# Patient Record
Sex: Female | Born: 1960 | Race: Black or African American | Hispanic: No | Smoking: Current every day smoker
Health system: Southern US, Community
[De-identification: ages and names within clinical notes are randomized; demographics above are authoritative.]

## PROBLEM LIST (undated history)

## (undated) DIAGNOSIS — F410 Panic disorder [episodic paroxysmal anxiety] without agoraphobia: Secondary | ICD-10-CM

## (undated) HISTORY — PX: ABDOMINAL HYSTERECTOMY: SHX81

## (undated) HISTORY — PX: CHOLECYSTECTOMY: SHX55

---

## 2004-02-06 ENCOUNTER — Emergency Department: Payer: Self-pay | Admitting: General Practice

## 2004-11-22 ENCOUNTER — Emergency Department: Payer: Self-pay | Admitting: Emergency Medicine

## 2004-12-08 ENCOUNTER — Ambulatory Visit: Payer: Self-pay | Admitting: Family Medicine

## 2006-09-26 ENCOUNTER — Emergency Department: Payer: Self-pay | Admitting: Emergency Medicine

## 2007-05-06 ENCOUNTER — Emergency Department: Payer: Self-pay | Admitting: Emergency Medicine

## 2009-01-31 ENCOUNTER — Emergency Department: Payer: Self-pay | Admitting: Emergency Medicine

## 2009-03-11 ENCOUNTER — Emergency Department: Payer: Self-pay | Admitting: Emergency Medicine

## 2009-09-20 ENCOUNTER — Emergency Department: Payer: Self-pay | Admitting: Internal Medicine

## 2009-12-16 ENCOUNTER — Inpatient Hospital Stay: Payer: Self-pay | Admitting: Surgery

## 2009-12-20 LAB — PATHOLOGY REPORT

## 2010-03-03 ENCOUNTER — Emergency Department: Payer: Self-pay | Admitting: Emergency Medicine

## 2011-07-14 ENCOUNTER — Emergency Department: Payer: Self-pay | Admitting: *Deleted

## 2013-11-30 ENCOUNTER — Emergency Department: Payer: Self-pay

## 2013-11-30 LAB — CBC
HCT: 46.9 % (ref 35.0–47.0)
HGB: 15.3 g/dL (ref 12.0–16.0)
MCH: 29.4 pg (ref 26.0–34.0)
MCHC: 32.7 g/dL (ref 32.0–36.0)
MCV: 90 fL (ref 80–100)
Platelet: 213 10*3/uL (ref 150–440)
RBC: 5.23 10*6/uL — ABNORMAL HIGH (ref 3.80–5.20)
RDW: 14.5 % (ref 11.5–14.5)
WBC: 14 10*3/uL — ABNORMAL HIGH (ref 3.6–11.0)

## 2013-11-30 LAB — BASIC METABOLIC PANEL
Anion Gap: 9 (ref 7–16)
BUN: 8 mg/dL (ref 7–18)
CALCIUM: 9.1 mg/dL (ref 8.5–10.1)
CREATININE: 0.6 mg/dL (ref 0.60–1.30)
Chloride: 104 mmol/L (ref 98–107)
Co2: 24 mmol/L (ref 21–32)
EGFR (African American): >60
GLUCOSE: 99 mg/dL (ref 65–99)
Osmolality: 272 (ref 275–301)
POTASSIUM: 3.8 mmol/L (ref 3.5–5.1)
SODIUM: 137 mmol/L (ref 136–145)

## 2013-11-30 LAB — URINALYSIS, COMPLETE
BACTERIA: NONE SEEN
BLOOD: NEGATIVE
Bilirubin,UR: NEGATIVE
GLUCOSE, UR: NEGATIVE mg/dL (ref 0–75)
Ketone: NEGATIVE
Nitrite: NEGATIVE
PH: 5 (ref 4.5–8.0)
Protein: NEGATIVE
RBC,UR: NONE SEEN /HPF (ref 0–5)
Specific Gravity: 1.025 (ref 1.003–1.030)
Squamous Epithelial: 8

## 2013-11-30 LAB — TROPONIN I: Troponin-I: <0.02 ng/mL

## 2014-11-30 ENCOUNTER — Other Ambulatory Visit: Payer: Self-pay | Admitting: Internal Medicine

## 2014-11-30 DIAGNOSIS — Z1231 Encounter for screening mammogram for malignant neoplasm of breast: Secondary | ICD-10-CM

## 2014-12-05 ENCOUNTER — Ambulatory Visit: Payer: Self-pay

## 2014-12-07 ENCOUNTER — Ambulatory Visit: Payer: Self-pay | Attending: Internal Medicine

## 2014-12-23 ENCOUNTER — Emergency Department: Payer: 59

## 2014-12-23 ENCOUNTER — Emergency Department
Admission: EM | Admit: 2014-12-23 | Discharge: 2014-12-23 | Disposition: A | Discharge status: Home / Self Care | Payer: 59 | Attending: Emergency Medicine | Admitting: Emergency Medicine

## 2014-12-23 ENCOUNTER — Encounter: Payer: Self-pay | Admitting: Emergency Medicine

## 2014-12-23 DIAGNOSIS — Y9389 Activity, other specified: Secondary | ICD-10-CM | POA: Insufficient documentation

## 2014-12-23 DIAGNOSIS — S79912A Unspecified injury of left hip, initial encounter: Secondary | ICD-10-CM | POA: Insufficient documentation

## 2014-12-23 DIAGNOSIS — Z720 Tobacco use: Secondary | ICD-10-CM | POA: Insufficient documentation

## 2014-12-23 DIAGNOSIS — S060X1A Concussion with loss of consciousness of 30 minutes or less, initial encounter: Secondary | ICD-10-CM | POA: Diagnosis not present

## 2014-12-23 DIAGNOSIS — S199XXA Unspecified injury of neck, initial encounter: Secondary | ICD-10-CM | POA: Diagnosis not present

## 2014-12-23 DIAGNOSIS — Y92410 Unspecified street and highway as the place of occurrence of the external cause: Secondary | ICD-10-CM | POA: Insufficient documentation

## 2014-12-23 DIAGNOSIS — S0990XA Unspecified injury of head, initial encounter: Secondary | ICD-10-CM | POA: Diagnosis present

## 2014-12-23 DIAGNOSIS — S4992XA Unspecified injury of left shoulder and upper arm, initial encounter: Secondary | ICD-10-CM | POA: Insufficient documentation

## 2014-12-23 DIAGNOSIS — S0001XA Abrasion of scalp, initial encounter: Secondary | ICD-10-CM | POA: Diagnosis not present

## 2014-12-23 DIAGNOSIS — Z791 Long term (current) use of non-steroidal anti-inflammatories (NSAID): Secondary | ICD-10-CM | POA: Insufficient documentation

## 2014-12-23 DIAGNOSIS — Y998 Other external cause status: Secondary | ICD-10-CM | POA: Diagnosis not present

## 2014-12-23 DIAGNOSIS — M79605 Pain in left leg: Secondary | ICD-10-CM

## 2014-12-23 MED ORDER — ACETAMINOPHEN 500 MG PO TABS
ORAL_TABLET | ORAL | Status: AC
  Administered 2014-12-23: 1000 mg via ORAL
  Filled 2014-12-23: qty 2

## 2014-12-23 MED ORDER — ACETAMINOPHEN 500 MG PO TABS
1000.0000 mg | ORAL_TABLET | Freq: Once | ORAL | Status: AC
  Administered 2014-12-23: 1000 mg via ORAL

## 2014-12-23 NOTE — ED Notes (Signed)
c-spine precautions removed ATT
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
## 2014-12-23 NOTE — ED Notes (Signed)
Patient transported to CT 

## 2014-12-23 NOTE — Discharge Instructions (Signed)
Abrasion An abrasion is a cut or scrape of the skin. Abrasions do not extend through all layers of the skin and most heal within 10 days. It is important to care for your abrasion properly to prevent infection. CAUSES  Most abrasions are caused by falling on, or gliding across, the ground or other surface. When your skin rubs on something, the outer and inner layer of skin rubs off, causing an abrasion. DIAGNOSIS  Your caregiver will be able to diagnose an abrasion during a physical exam.  TREATMENT  Your treatment depends on how large and deep the abrasion is. Generally, your abrasion will be cleaned with water and a mild soap to remove any dirt or debris. An antibiotic ointment may be put over the abrasion to prevent an infection. A bandage (dressing) may be wrapped around the abrasion to keep it from getting dirty.  You may need a tetanus shot if:  You cannot remember when you had your last tetanus shot.  You have never had a tetanus shot.  The injury broke your skin. If you get a tetanus shot, your arm may swell, get red, and feel warm to the touch. This is common and not a problem. If you need a tetanus shot and you choose not to have one, there is a rare chance of getting tetanus. Sickness from tetanus can be serious.  HOME CARE INSTRUCTIONS   If a dressing was applied, change it at least once a day or as directed by your caregiver. If the bandage sticks, soak it off with warm water.   Wash the area with water and a mild soap to remove all the ointment 2 times a day. Rinse off the soap and pat the area dry with a clean towel.   Reapply any ointment as directed by your caregiver. This will help prevent infection and keep the bandage from sticking. Use gauze over the wound and under the dressing to help keep the bandage from sticking.   Change your dressing right away if it becomes wet or dirty.   Only take over-the-counter or prescription medicines for pain, discomfort, or fever as  directed by your caregiver.   Follow up with your caregiver within 24-48 hours for a wound check, or as directed. If you were not given a wound-check appointment, look closely at your abrasion for redness, swelling, or pus. These are signs of infection. SEEK IMMEDIATE MEDICAL CARE IF:   You have increasing pain in the wound.   You have redness, swelling, or tenderness around the wound.   You have pus coming from the wound.   You have a fever or persistent symptoms for more than 2-3 days.  You have a fever and your symptoms suddenly get worse.  You have a bad smell coming from the wound or dressing.  MAKE SURE YOU:   Understand these instructions.  Will watch your condition.  Will get help right away if you are not doing well or get worse. Document Released: 01/28/2005 Document Revised: 04/06/2012 Document Reviewed: 03/24/2011 [facility] Patient Information 2015 [place], [place]. This information is not intended to replace advice given to you by your health care provider. Make sure you discuss any questions you have with your health care provider.  Concussion A concussion, or closed-head injury, is a brain injury caused by a direct blow to the head or by a quick and sudden movement (jolt) of the head or neck. Concussions are usually not life-threatening. Even so, the effects of a concussion can be  serious. If you have had a concussion before, you are more likely to experience concussion-like symptoms after a direct blow to the head.  CAUSES  Direct blow to the head, such as from running into another player during a soccer game, being hit in a fight, or hitting your head on a hard surface.  A jolt of the head or neck that causes the brain to move back and forth inside the skull, such as in a car crash. SIGNS AND SYMPTOMS The signs of a concussion can be hard to notice. Early on, they may be missed by you, family members, and health care providers. You may look fine but act or feel  differently. Symptoms are usually temporary, but they may last for days, weeks, or even longer. Some symptoms may appear right away while others may not show up for hours or days. Every head injury is different. Symptoms include:  Mild to moderate headaches that will not go away.  A feeling of pressure inside your head.  Having more trouble than usual:  Learning or remembering things you have heard.  Answering questions.  Paying attention or concentrating.  Organizing daily tasks.  Making decisions and solving problems.  Slowness in thinking, acting or reacting, speaking, or reading.  Getting lost or being easily confused.  Feeling tired all the time or lacking energy (fatigued).  Feeling drowsy.  Sleep disturbances.  Sleeping more than usual.  Sleeping less than usual.  Trouble falling asleep.  Trouble sleeping (insomnia).  Loss of balance or feeling lightheaded or dizzy.  Nausea or vomiting.  Numbness or tingling.  Increased sensitivity to:  Sounds.  Lights.  Distractions.  Vision problems or eyes that tire easily.  Diminished sense of taste or smell.  Ringing in the ears.  Mood changes such as feeling sad or anxious.  Becoming easily irritated or angry for little or no reason.  Lack of motivation.  Seeing or hearing things other people do not see or hear (hallucinations). DIAGNOSIS Your health care provider can usually diagnose a concussion based on a description of your injury and symptoms. He or she will ask whether you passed out (lost consciousness) and whether you are having trouble remembering events that happened right before and during your injury. Your evaluation might include:  A brain scan to look for signs of injury to the brain. Even if the test shows no injury, you may still have a concussion.  Blood tests to be sure other problems are not present. TREATMENT  Concussions are usually treated in an emergency department, in urgent  care, or at a clinic. You may need to stay in the hospital overnight for further treatment.  Tell your health care provider if you are taking any medicines, including prescription medicines, over-the-counter medicines, and natural remedies. Some medicines, such as blood thinners (anticoagulants) and aspirin, may increase the chance of complications. Also tell your health care provider whether you have had alcohol or are taking illegal drugs. This information may affect treatment.  Your health care provider will send you home with important instructions to follow.  How fast you will recover from a concussion depends on many factors. These factors include how severe your concussion is, what part of your brain was injured, your age, and how healthy you were before the concussion.  Most people with mild injuries recover fully. Recovery can take time. In general, recovery is slower in older persons. Also, persons who have had a concussion in the past or have other medical problems  may find that it takes longer to recover from their current injury. HOME CARE INSTRUCTIONS General Instructions  Carefully follow the directions your health care provider gave you.  Only take over-the-counter or prescription medicines for pain, discomfort, or fever as directed by your health care provider.  Take only those medicines that your health care provider has approved.  Do not drink alcohol until your health care provider says you are well enough to do so. Alcohol and certain other drugs may slow your recovery and can put you at risk of further injury.  If it is harder than usual to remember things, write them down.  If you are easily distracted, try to do one thing at a time. For example, do not try to watch TV while fixing dinner.  Talk with family members or close friends when making important decisions.  Keep all follow-up appointments. Repeated evaluation of your symptoms is recommended for your  recovery.  Watch your symptoms and tell others to do the same. Complications sometimes occur after a concussion. Older adults with a brain injury may have a higher risk of serious complications, such as a blood clot on the brain.  Tell your teachers, school nurse, school counselor, coach, athletic trainer, or work Freight forwarder about your injury, symptoms, and restrictions. Tell them about what you can or cannot do. They should watch for:  Increased problems with attention or concentration.  Increased difficulty remembering or learning new information.  Increased time needed to complete tasks or assignments.  Increased irritability or decreased ability to cope with stress.  Increased symptoms.  Rest. Rest helps the brain to heal. Make sure you:  Get plenty of sleep at night. Avoid staying up late at night.  Keep the same bedtime hours on weekends and weekdays.  Rest during the day. Take daytime naps or rest breaks when you feel tired.  Limit activities that require a lot of thought or concentration. These include:  Doing homework or job-related work.  Watching TV.  Working on the computer.  Avoid any situation where there is potential for another head injury (football, hockey, soccer, basketball, martial arts, downhill snow sports and horseback riding). Your condition will get worse every time you experience a concussion. You should avoid these activities until you are evaluated by the appropriate follow-up health care providers. Returning To Your Regular Activities You will need to return to your normal activities slowly, not all at once. You must give your body and brain enough time for recovery.  Do not return to sports or other athletic activities until your health care provider tells you it is safe to do so.  Ask your health care provider when you can drive, ride a bicycle, or operate heavy machinery. Your ability to react may be slower after a brain injury. Never do these  activities if you are dizzy.  Ask your health care provider about when you can return to work or school. Preventing Another Concussion It is very important to avoid another brain injury, especially before you have recovered. In rare cases, another injury can lead to permanent brain damage, brain swelling, or death. The risk of this is greatest during the first 7-10 days after a head injury. Avoid injuries by:  Wearing a seat belt when riding in a car.  Drinking alcohol only in moderation.  Wearing a helmet when biking, skiing, skateboarding, skating, or doing similar activities.  Avoiding activities that could lead to a second concussion, such as contact or recreational sports, until your health  care provider says it is okay.  Taking safety measures in your home.  Remove clutter and tripping hazards from floors and stairways.  Use grab bars in bathrooms and handrails by stairs.  Place non-slip mats on floors and in bathtubs.  Improve lighting in dim areas. SEEK MEDICAL CARE IF:  You have increased problems paying attention or concentrating.  You have increased difficulty remembering or learning new information.  You need more time to complete tasks or assignments than before.  You have increased irritability or decreased ability to cope with stress.  You have more symptoms than before. Seek medical care if you have any of the following symptoms for more than 2 weeks after your injury:  Lasting (chronic) headaches.  Dizziness or balance problems.  Nausea.  Vision problems.  Increased sensitivity to noise or light.  Depression or mood swings.  Anxiety or irritability.  Memory problems.  Difficulty concentrating or paying attention.  Sleep problems.  Feeling tired all the time. SEEK IMMEDIATE MEDICAL CARE IF:  You have severe or worsening headaches. These may be a sign of a blood clot in the brain.  You have weakness (even if only in one hand, leg, or part of  the face).  You have numbness.  You have decreased coordination.  You vomit repeatedly.  You have increased sleepiness.  One pupil is larger than the other.  You have convulsions.  You have slurred speech.  You have increased confusion. This may be a sign of a blood clot in the brain.  You have increased restlessness, agitation, or irritability.  You are unable to recognize people or places.  You have neck pain.  It is difficult to wake you up.  You have unusual behavior changes.  You lose consciousness. MAKE SURE YOU:  Understand these instructions.  Will watch your condition.  Will get help right away if you are not doing well or get worse. Document Released: 07/11/2003 Document Revised: 04/25/2013 Document Reviewed: 11/10/2012 [facility] Patient Information 2015 [place], [place]. This information is not intended to replace advice given to you by your health care provider. Make sure you discuss any questions you have with your health care provider.  Musculoskeletal Pain Musculoskeletal pain is muscle and boney aches and pains. These pains can occur in any part of the body. Your caregiver may treat you without knowing the cause of the pain. They may treat you if blood or urine tests, X-rays, and other tests were normal.  CAUSES There is often not a definite cause or reason for these pains. These pains may be caused by a type of germ (virus). The discomfort may also come from overuse. Overuse includes working out too hard when your body is not fit. Boney aches also come from weather changes. Bone is sensitive to atmospheric pressure changes. HOME CARE INSTRUCTIONS   Ask when your test results will be ready. Make sure you get your test results.  Only take over-the-counter or prescription medicines for pain, discomfort, or fever as directed by your caregiver. If you were given medications for your condition, do not drive, operate machinery or power tools, or sign legal  documents for 24 hours. Do not drink alcohol. Do not take sleeping pills or other medications that may interfere with treatment.  Continue all activities unless the activities cause more pain. When the pain lessens, slowly resume normal activities. Gradually increase the intensity and duration of the activities or exercise.  During periods of severe pain, bed rest may be helpful. Lay or  sit in any position that is comfortable.  Putting ice on the injured area.  Put ice in a bag.  Place a towel between your skin and the bag.  Leave the ice on for 15 to 20 minutes, 3 to 4 times a day.  Follow up with your caregiver for continued problems and no reason can be found for the pain. If the pain becomes worse or does not go away, it may be necessary to repeat tests or do additional testing. Your caregiver may need to look further for a possible cause. SEEK IMMEDIATE MEDICAL CARE IF:  You have pain that is getting worse and is not relieved by medications.  You develop chest pain that is associated with shortness or breath, sweating, feeling sick to your stomach (nauseous), or throw up (vomit).  Your pain becomes localized to the abdomen.  You develop any new symptoms that seem different or that concern you. MAKE SURE YOU:   Understand these instructions.  Will watch your condition.  Will get help right away if you are not doing well or get worse. Document Released: 04/20/2005 Document Revised: 07/13/2011 Document Reviewed: 12/23/2012 [facility] [place] Patient Information 2015 [place], [place]. This information is not intended to replace advice given to you by your health care provider. Make sure you discuss any questions you have with your health care provider.

## 2014-12-25 ENCOUNTER — Emergency Department: Payer: 59

## 2014-12-25 ENCOUNTER — Encounter: Payer: Self-pay | Admitting: Emergency Medicine

## 2014-12-25 ENCOUNTER — Emergency Department
Admission: EM | Admit: 2014-12-25 | Discharge: 2014-12-26 | Disposition: A | Discharge status: Home / Self Care | Payer: 59 | Attending: Emergency Medicine | Admitting: Emergency Medicine

## 2014-12-25 DIAGNOSIS — R079 Chest pain, unspecified: Secondary | ICD-10-CM | POA: Insufficient documentation

## 2014-12-25 DIAGNOSIS — Z720 Tobacco use: Secondary | ICD-10-CM | POA: Diagnosis not present

## 2014-12-25 DIAGNOSIS — Z880 Allergy status to penicillin: Secondary | ICD-10-CM | POA: Diagnosis not present

## 2014-12-25 DIAGNOSIS — S299XXD Unspecified injury of thorax, subsequent encounter: Secondary | ICD-10-CM | POA: Diagnosis not present

## 2014-12-25 DIAGNOSIS — W228XXD Striking against or struck by other objects, subsequent encounter: Secondary | ICD-10-CM | POA: Insufficient documentation

## 2014-12-25 DIAGNOSIS — F419 Anxiety disorder, unspecified: Secondary | ICD-10-CM | POA: Diagnosis not present

## 2014-12-25 HISTORY — DX: Panic disorder (episodic paroxysmal anxiety): F41.0

## 2014-12-25 LAB — CBC WITH DIFFERENTIAL/PLATELET
Basophils Absolute: 0.2 10*3/uL — ABNORMAL HIGH (ref 0–0.1)
Basophils Relative: 1 %
EOS ABS: 0.3 10*3/uL (ref 0–0.7)
Eosinophils Relative: 2 %
HCT: 40.1 % (ref 35.0–47.0)
HEMOGLOBIN: 13.3 g/dL (ref 12.0–16.0)
LYMPHS ABS: 4.8 10*3/uL — AB (ref 1.0–3.6)
LYMPHS PCT: 31 %
MCH: 28.7 pg (ref 26.0–34.0)
MCHC: 33.1 g/dL (ref 32.0–36.0)
MCV: 86.5 fL (ref 80.0–100.0)
MONOS PCT: 6 %
Monocytes Absolute: 0.9 10*3/uL (ref 0.2–0.9)
NEUTROS PCT: 60 %
Neutro Abs: 9.5 10*3/uL — ABNORMAL HIGH (ref 1.4–6.5)
Platelets: 236 10*3/uL (ref 150–440)
RBC: 4.64 MIL/uL (ref 3.80–5.20)
RDW: 14.5 % (ref 11.5–14.5)
WBC: 15.8 10*3/uL — AB (ref 3.6–11.0)

## 2014-12-25 LAB — BASIC METABOLIC PANEL
Anion gap: 9 (ref 5–15)
BUN: 17 mg/dL (ref 6–20)
CHLORIDE: 107 mmol/L (ref 101–111)
CO2: 20 mmol/L — AB (ref 22–32)
CREATININE: 0.81 mg/dL (ref 0.44–1.00)
Calcium: 9.8 mg/dL (ref 8.9–10.3)
GFR calc non Af Amer: >60 mL/min (ref >60)
Glucose, Bld: 97 mg/dL (ref 65–99)
POTASSIUM: 3.9 mmol/L (ref 3.5–5.1)
SODIUM: 136 mmol/L (ref 135–145)

## 2014-12-25 LAB — TROPONIN I: Troponin I: <0.03 ng/mL (ref <0.031)

## 2014-12-25 MED ORDER — MORPHINE SULFATE (PF) 2 MG/ML IV SOLN
INTRAVENOUS | Status: AC
  Filled 2014-12-25: qty 1

## 2014-12-25 MED ORDER — ONDANSETRON HCL 4 MG/2ML IJ SOLN
4.0000 mg | Freq: Once | INTRAMUSCULAR | Status: AC
  Administered 2014-12-25: 4 mg via INTRAVENOUS
  Filled 2014-12-25: qty 2

## 2014-12-25 MED ORDER — MORPHINE SULFATE (PF) 2 MG/ML IV SOLN
2.0000 mg | Freq: Once | INTRAVENOUS | Status: AC
  Administered 2014-12-25: 2 mg via INTRAVENOUS
  Filled 2014-12-25: qty 1

## 2014-12-25 MED ORDER — IOHEXOL 350 MG/ML SOLN
100.0000 mL | Freq: Once | INTRAVENOUS | Status: AC | PRN
  Administered 2014-12-25: 100 mL via INTRAVENOUS
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
## 2014-12-26 LAB — TROPONIN I: Troponin I: <0.03 ng/mL (ref <0.031)
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
## 2014-12-26 NOTE — ED Provider Notes (Signed)
-----------------------------------------   3:07 AM on 12/26/2014 -----------------------------------------   Blood pressure 117/78, pulse 92, temperature 98.1 F (36.7 C), temperature source Oral, resp. rate 14, SpO2 100 %.  Assuming care from Dr. Darnelle Catalan.  In short, Traci Torres is a 54 y.o. female with a chief complaint of Chest Pain .  Refer to the original H&P for additional details.  The current plan of care is to follow-up the troponin and disposition the patient.  The patient had a repeat troponin at 2 AM which resulted unremarkable. The patient has been resting comfortably while in the emergency department. She'll be discharged home to follow-up with her primary care physician.   [name], MD 12/26/14 [phone]

## 2015-08-09 ENCOUNTER — Ambulatory Visit: Payer: Self-pay | Admitting: Sports Medicine

## 2015-11-13 ENCOUNTER — Encounter: Payer: Self-pay | Admitting: Emergency Medicine

## 2015-11-13 ENCOUNTER — Emergency Department: Payer: 59

## 2015-11-13 ENCOUNTER — Emergency Department
Admission: EM | Admit: 2015-11-13 | Discharge: 2015-11-13 | Disposition: A | Discharge status: Home / Self Care | Payer: 59 | Attending: Emergency Medicine | Admitting: Emergency Medicine

## 2015-11-13 DIAGNOSIS — R55 Syncope and collapse: Secondary | ICD-10-CM | POA: Diagnosis not present

## 2015-11-13 DIAGNOSIS — R51 Headache: Secondary | ICD-10-CM | POA: Diagnosis present

## 2015-11-13 DIAGNOSIS — F17210 Nicotine dependence, cigarettes, uncomplicated: Secondary | ICD-10-CM | POA: Diagnosis not present

## 2015-11-13 DIAGNOSIS — R519 Headache, unspecified: Secondary | ICD-10-CM

## 2015-11-13 LAB — BASIC METABOLIC PANEL
ANION GAP: 7 (ref 5–15)
BUN: 14 mg/dL (ref 6–20)
CALCIUM: 9.8 mg/dL (ref 8.9–10.3)
CHLORIDE: 103 mmol/L (ref 101–111)
CO2: 30 mmol/L (ref 22–32)
CREATININE: 0.8 mg/dL (ref 0.44–1.00)
GFR calc non Af Amer: >60 mL/min (ref >60)
Glucose, Bld: 104 mg/dL — ABNORMAL HIGH (ref 65–99)
Potassium: 3.9 mmol/L (ref 3.5–5.1)
SODIUM: 140 mmol/L (ref 135–145)

## 2015-11-13 LAB — URINALYSIS COMPLETE WITH MICROSCOPIC (ARMC ONLY)
BACTERIA UA: NONE SEEN
BILIRUBIN URINE: NEGATIVE
GLUCOSE, UA: NEGATIVE mg/dL
Hgb urine dipstick: NEGATIVE
Ketones, ur: NEGATIVE mg/dL
Leukocytes, UA: NEGATIVE
NITRITE: NEGATIVE
Protein, ur: NEGATIVE mg/dL
SPECIFIC GRAVITY, URINE: 1.028 (ref 1.005–1.030)
pH: 5 (ref 5.0–8.0)

## 2015-11-13 LAB — CBC
HCT: 42.6 % (ref 35.0–47.0)
HEMOGLOBIN: 14.3 g/dL (ref 12.0–16.0)
MCH: 29.3 pg (ref 26.0–34.0)
MCHC: 33.5 g/dL (ref 32.0–36.0)
MCV: 87.4 fL (ref 80.0–100.0)
PLATELETS: 207 10*3/uL (ref 150–440)
RBC: 4.87 MIL/uL (ref 3.80–5.20)
RDW: 14.1 % (ref 11.5–14.5)
WBC: 9.6 10*3/uL (ref 3.6–11.0)

## 2015-11-13 LAB — GLUCOSE, CAPILLARY: GLUCOSE-CAPILLARY: 91 mg/dL (ref 65–99)

## 2015-11-13 MED ORDER — MECLIZINE HCL 25 MG PO TABS: 25.0000 mg | ORAL_TABLET | Freq: Three times a day (TID) | ORAL | Status: DC | PRN

## 2015-11-13 MED ORDER — MECLIZINE HCL 25 MG PO TABS
50.0000 mg | ORAL_TABLET | Freq: Once | ORAL | Status: AC
  Administered 2015-11-13: 50 mg via ORAL
  Filled 2015-11-13: qty 2

## 2015-11-13 MED ORDER — PREDNISONE 20 MG PO TABS
40.0000 mg | ORAL_TABLET | ORAL | Status: AC
  Administered 2015-11-13: 40 mg via ORAL

## 2015-11-13 MED ORDER — MECLIZINE HCL 25 MG PO TABS
ORAL_TABLET | ORAL | Status: AC
  Filled 2015-11-13: qty 1

## 2015-11-13 MED ORDER — ONDANSETRON 4 MG PO TBDP: 4.0000 mg | ORAL_TABLET | Freq: Three times a day (TID) | ORAL | Status: DC | PRN

## 2015-11-13 MED ORDER — ONDANSETRON 8 MG PO TBDP
ORAL_TABLET | ORAL | Status: AC
  Administered 2015-11-13: 8 mg via ORAL
  Filled 2015-11-13: qty 1

## 2015-11-13 MED ORDER — PREDNISONE 20 MG PO TABS
ORAL_TABLET | ORAL | Status: AC
  Administered 2015-11-13: 40 mg via ORAL
  Filled 2015-11-13: qty 2

## 2015-11-13 MED ORDER — MECLIZINE HCL 25 MG PO TABS
ORAL_TABLET | ORAL | Status: AC
  Administered 2015-11-13: 50 mg via ORAL
  Filled 2015-11-13: qty 1

## 2015-11-13 MED ORDER — ONDANSETRON 4 MG PO TBDP
8.0000 mg | ORAL_TABLET | Freq: Once | ORAL | Status: AC
  Administered 2015-11-13: 8 mg via ORAL

## 2015-11-13 MED ORDER — PREDNISONE 20 MG PO TABS: 40.0000 mg | ORAL_TABLET | Freq: Every day | ORAL | Status: DC

## 2015-11-13 NOTE — ED Notes (Signed)
Pt states a "lapse in time" on Sunday, states she feels as if she had a seizure, denies any hx of seizure, denies any urinary incontienence or biting her tongue, states since then she has had neck pain and a headache, pt awake and alert in no acute distress

## 2015-11-13 NOTE — ED Notes (Signed)
Pt given ginger ale. Lights dimmed.

## 2015-11-13 NOTE — Discharge Instructions (Signed)

## 2015-11-13 NOTE — ED Notes (Signed)
Pt presents to ED with reports of near syncope three days ago. Pt states after episode she began to have a severe headache that has persisted since then. Pt reports nausea and increasingly blurred vision. Pt reports dizziness and lightheadedness. Pt alert and oriented in triage. Neurological exam WNL.

## 2015-11-13 NOTE — ED Provider Notes (Signed)
[facility] Emergency Department Provider Note  ____________________________________________  Time seen: 5:50 PM  I have reviewed the triage vital signs and the nursing notes.   HISTORY  Chief Complaint Headache and Near Syncope    HPI Traci Torres is a 55 y.o. female who reports she was in her usual state of health until 3 days ago, when she was lying back on a chair and felt like she lost consciousness for a brief period of time. She is not sure how long he does but thought maybe it felt like 1 minute. Afterward she did not have any urinary incontinence or tongue injuries. She did not feel like she was confused, but does state that she's had a headache with neck pain since then. She's also had generalized weakness and malaise with decreased appetite over the last 3 days. Headache has been constant since then, worse in the bilateral frontal periorbital area. Has nausea and dizziness. Dizziness is described as a sense of motion and vertigo that is worse with turning of head. There had anything like this before. No recent illness or trauma. No fever.  She reports she had been taking phentermine for diet control and weight loss, but since this episode 3 days ago she stopped taking the phentermine. No history of seizures. Does not drink     Past Medical History  Diagnosis Date  . Panic attacks      There are no active problems to display for this patient.    Past Surgical History  Procedure Laterality Date  . Abdominal hysterectomy    . Cholecystectomy       Current Outpatient Rx  Name  Route  Sig  Dispense  Refill  . meclizine (ANTIVERT) 25 MG tablet   Oral   Take 1 tablet (25 mg total) by mouth 3 (three) times daily as needed for dizziness or nausea.   30 tablet   1   . ondansetron (ZOFRAN ODT) 4 MG disintegrating tablet   Oral   Take 1 tablet (4 mg total) by mouth every 8 (eight) hours as needed for nausea or vomiting.   20 tablet   0    . predniSONE (DELTASONE) 20 MG tablet   Oral   Take 2 tablets (40 mg total) by mouth daily.   8 tablet   0      Allergies Penicillins and Percocet   Family History  Problem Relation Age of Onset  . Heart failure Father     Social History Social History  Substance Use Topics  . Smoking status: Current Every Day Smoker -- 0.50 packs/day    Types: Cigarettes  . Smokeless tobacco: None  . Alcohol Use: Yes    Review of Systems  Constitutional:   No fever or chills.  ENT:   No sore throat. No rhinorrhea. Cardiovascular:   No chest pain. Respiratory:   No dyspnea or cough. Gastrointestinal:   Negative for abdominal pain, vomiting and diarrhea.  Genitourinary:   Negative for dysuria or difficulty urinating. Musculoskeletal:   Negative for focal pain or swelling Neurological:   Positive as above for headache 10-point ROS otherwise negative.  ____________________________________________   PHYSICAL EXAM:  VITAL SIGNS: ED Triage Vitals  Enc Vitals Group     BP 11/13/15 1630 141/89 mmHg     Pulse Rate 11/13/15 1630 73     Resp 11/13/15 1630 18     Temp 11/13/15 1630 98.4 F (36.9 C)     Temp Source 11/13/15 1630  Oral     SpO2 11/13/15 1630 100 %     Weight 11/13/15 1630 244 lb (110.678 kg)     Height 11/13/15 1630 5\' 8"  (1.727 m)     Head Cir --      Peak Flow --      Pain Score 11/13/15 1631 8     Pain Loc --      Pain Edu? --      Excl. in GC? --     Vital signs reviewed, nursing assessments reviewed.   Constitutional:   Alert and oriented. Well appearing and in no distress. Eyes:   No scleral icterus. No conjunctival pallor. PERRL. EOMI.  No nystagmus. ENT   Head:   Normocephalic and atraumatic. TMs normal. Reports "pressure" sensation with percussion of sinuses but not sharply painful   Nose:   No congestion/rhinnorhea. No septal hematoma   Mouth/Throat:   MMM, no pharyngeal erythema. No peritonsillar mass.    Neck:   No stridor. No SubQ  emphysema. No meningismus. Thyroid nonpalpable Hematological/Lymphatic/Immunilogical:   No cervical lymphadenopathy. Cardiovascular:   RRR. Symmetric bilateral radial and DP pulses.  No murmurs.  Respiratory:   Normal respiratory effort without tachypnea nor retractions. Breath sounds are clear and equal bilaterally. No wheezes/rales/rhonchi. Gastrointestinal:   Soft and nontender. Non distended. There is no CVA tenderness.  No rebound, rigidity, or guarding. Genitourinary:   deferred Musculoskeletal:   Nontender with normal range of motion in all extremities. No joint effusions.  No lower extremity tenderness.  No edema. Neurologic:   Normal speech and language.  CN 2-10 normal. Motor grossly intact. No gross focal neurologic deficits are appreciated.  Skin:    Skin is warm, dry and intact. No rash noted.  No petechiae, purpura, or bullae.  ____________________________________________    LABS (pertinent positives/negatives) (all labs ordered are listed, but only abnormal results are displayed) Labs Reviewed  BASIC METABOLIC PANEL - Abnormal; Notable for the following:    Glucose, Bld 104 (*)    All other components within normal limits  URINALYSIS COMPLETEWITH MICROSCOPIC ([facility] ONLY) - Abnormal; Notable for the following:    Color, Urine YELLOW (*)    APPearance CLEAR (*)    Squamous Epithelial / LPF 0-5 (*)    All other components within normal limits  CBC  GLUCOSE, CAPILLARY  CBG MONITORING, ED   ____________________________________________   EKG  Interpreted by me  Date: 11/13/2015  Rate: 66  Rhythm: normal sinus rhythm  QRS Axis: normal  Intervals: normal  ST/T Wave abnormalities: normal  Conduction Disutrbances: none  Narrative Interpretation: unremarkable      ____________________________________________    RADIOLOGY  CT head  unremarkable  ____________________________________________   PROCEDURES   ____________________________________________   INITIAL IMPRESSION / ASSESSMENT AND PLAN / ED COURSE  Pertinent labs & imaging results that were available during my care of the patient were reviewed by me and considered in my medical decision making (see chart for details).  Patient presents with episode of syncope versus somnolence versus seizure 3 days ago with persistent headache since then. No findings. Well appearing. Vital signs unremarkable. Labs unremarkable. CT head unremarkable. Would treat symptomatically, follow-up with primary care and neurology. No significant evidence physically or by history of new onset seizure, would not load with antiepileptic drugs at this time. No evidence of bleeding or trauma, meningitis encephalitis or other infection, intracranial hypertension, or ocular pathology.   ----------------------------------------- 7:57 PM on 11/13/2015 -----------------------------------------  Workup negative. CT head negative. Patient  feeling better, tolerating by mouth, energy level improved. We'll continue on Zofran and meclizine prednisone, follow-up primary care and neurology for further evaluation.    ____________________________________________   FINAL CLINICAL IMPRESSION(S) / ED DIAGNOSES  Final diagnoses:  Acute nonintractable headache, unspecified headache type       Portions of this note were generated with dragon dictation software. Dictation errors may occur despite best attempts at proofreading.   Sharman Cheek, MD 11/13/15 [phone]

## 2016-12-14 ENCOUNTER — Emergency Department
Admission: EM | Admit: 2016-12-14 | Discharge: 2016-12-14 | Disposition: A | Discharge status: Home / Self Care | Payer: 59 | Attending: Emergency Medicine | Admitting: Emergency Medicine

## 2016-12-14 DIAGNOSIS — F17210 Nicotine dependence, cigarettes, uncomplicated: Secondary | ICD-10-CM | POA: Insufficient documentation

## 2016-12-14 DIAGNOSIS — H60501 Unspecified acute noninfective otitis externa, right ear: Secondary | ICD-10-CM | POA: Insufficient documentation

## 2016-12-14 DIAGNOSIS — H6011 Cellulitis of right external ear: Secondary | ICD-10-CM | POA: Insufficient documentation

## 2016-12-14 MED ORDER — NEOMYCIN-COLIST-HC-THONZONIUM 3.3-3-10-0.5 MG/ML OT SUSP: 4.0000 [drp] | Freq: Four times a day (QID) | OTIC | Status: DC

## 2016-12-14 MED ORDER — NEOMYCIN-POLYMYXIN-HC 3.5-10000-1 OT SUSP
OTIC | Status: AC
  Administered 2016-12-14: 11:00:00
  Filled 2016-12-14: qty 10

## 2016-12-14 MED ORDER — CLINDAMYCIN HCL 150 MG PO CAPS: ORAL_CAPSULE | ORAL | 0 refills | Status: DC

## 2016-12-14 NOTE — ED Triage Notes (Signed)
Pt reports right ear redness and swelling today, reports some pain started last night. Right ear appears red and swollen to outside.

## 2016-12-14 NOTE — Discharge Instructions (Signed)
Continue using eardrops 3 drops 4 times a day for the next 5 days. Ear wick will fall out on its own. Begin taking antibiotics as directed until completely finished. Follow up with your primary care doctor if any continued problems. You may take Tylenol or ibuprofen as needed for pain.

## 2016-12-14 NOTE — ED Notes (Signed)
See triage note  States she felt a tingling to right ear this am   States she rubbed her ear   Now has swelling and redness to right ear

## 2016-12-14 NOTE — ED Provider Notes (Signed)
[facility] Emergency Department Provider Note  ____________________________________________   First MD Initiated Contact with Patient 12/14/16 [phone]     (approximate)  I have reviewed the triage vital signs and the nursing notes.   HISTORY  Chief Complaint Cellulitis   HPI Traci Torres is a 56 y.o. female Center complaining of right ear pain. 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 She denies any trauma. She denies any swimming or new ear jewelry. She is unaware of any fever. Area is tender and swollen. She rates her pain as 5/10.   Past Medical History:  Diagnosis Date  . Panic attacks     There are no active problems to display for this patient.   Past Surgical History:  Procedure Laterality Date  . ABDOMINAL HYSTERECTOMY    . CHOLECYSTECTOMY      Prior to Admission medications   Medication Sig Start Date End Date Taking? Authorizing Provider  clindamycin (CLEOCIN) 150 MG capsule 2 capsules every 6 hours until finished 12/14/16   Tommi Rumps, PA-C    Allergies Penicillins and Percocet [oxycodone-acetaminophen]  Family History  Problem Relation Age of Onset  . Heart failure Father     Social History Social History  Substance Use Topics  . Smoking status: Current Every Day Smoker    Packs/day: 0.50    Types: Cigarettes  . Smokeless tobacco: Not on file  . Alcohol use Yes    Review of Systems Constitutional: No fever/chills ENT: Positive for right ear swelling and pain. Cardiovascular: Denies chest pain. Respiratory: Denies shortness of breath. Gastrointestinal:  No nausea, no vomiting.   Skin: Positive right ear swelling. Neurological: Negative for headaches, focal weakness or numbness.   ____________________________________________   PHYSICAL EXAM:  VITAL SIGNS: ED Triage Vitals  Enc Vitals Group     BP 12/14/16 0843 128/83     Pulse Rate 12/14/16 0843 76     Resp  12/14/16 0843 14     Temp 12/14/16 0843 98.1 F (36.7 C)     Temp Source 12/14/16 0843 Oral     SpO2 12/14/16 0843 100 %     Weight 12/14/16 0844 250 lb (113.4 kg)     Height 12/14/16 0844 5\' 7"  (1.702 m)     Head Circumference --      Peak Flow --      Pain Score 12/14/16 0839 5     Pain Loc --      Pain Edu? --      Excl. in GC? --    Constitutional: Alert and oriented. Well appearing and in no acute distress. Eyes: Conjunctivae are normal.  Head: Atraumatic. Nose: No congestion/rhinnorhea. On examination of the right ear there is moderate soft tissue swelling and some slight warmth appreciated. There is no swelling or erythema of the [place]. There is some swelling of the external canal. There is no active drainage. TM is not visible. There is some crusty exudate externally. Mouth/Throat: Mucous membranes are moist.  Oropharynx non-erythematous. Neck: No stridor.  Hematological/Lymphatic/Immunilogical: No cervical lymphadenopathy. Cardiovascular: Normal rate, regular rhythm. Grossly normal heart sounds.  Good peripheral circulation. Respiratory: Normal respiratory effort.  No retractions. Lungs CTAB. Musculoskeletal: Moves upper and lower extremities without difficulty and normal gait was noted. Neurologic:  Normal speech and language. No gross focal neurologic deficits are appreciated.  Skin:  Skin is warm, dry and intact. No rash noted. Psychiatric: Mood and affect are normal. Speech  and behavior are normal.  ____________________________________________   LABS (all labs ordered are listed, but only abnormal results are displayed)  Labs Reviewed - No data to display   PROCEDURES  Procedure(s) performed: Clear wick was placed in the right canal along with Cortisporin otic suspension without difficulty.  Procedures  Critical Care performed: No  ____________________________________________   INITIAL IMPRESSION / ASSESSMENT AND PLAN / ED COURSE  Pertinent labs &  imaging results that were available during my care of the patient were reviewed by me and considered in my medical decision making (see chart for details).  Patient is given prescription for clindamycin 150 mg 2 capsules every 6 hours for the next 7 days. She is also to continue using the Cortisporin otic suspension every 6 hours. She is aware that the wick will fall out on its on. She is to follow-up with her PCP if any continued problems. She may take Tylenol or ibuprofen as needed for pain.   ____________________________________________   FINAL CLINICAL IMPRESSION(S) / ED DIAGNOSES  Final diagnoses:  Cellulitis of right external ear  Acute otitis externa of right ear, unspecified type      NEW MEDICATIONS STARTED DURING THIS VISIT:  Discharge Medication List as of 12/14/2016 10:34 AM    START taking these medications   Details  clindamycin (CLEOCIN) 150 MG capsule 2 capsules every 6 hours until finished, Print         Note:  This document was prepared using Dragon voice recognition software and may include unintentional dictation errors.    Tommi RumpsSummers, Rhonda L, PA-C 12/14/16 1047    Dionne BucySiadecki, Sebastian, MD 12/14/16 1455

## 2017-08-09 ENCOUNTER — Emergency Department: Payer: No Typology Code available for payment source

## 2017-08-09 ENCOUNTER — Emergency Department
Admission: EM | Admit: 2017-08-09 | Discharge: 2017-08-09 | Disposition: A | Discharge status: Home / Self Care | Payer: No Typology Code available for payment source | Attending: Emergency Medicine | Admitting: Emergency Medicine

## 2017-08-09 ENCOUNTER — Other Ambulatory Visit: Payer: Self-pay

## 2017-08-09 DIAGNOSIS — F17210 Nicotine dependence, cigarettes, uncomplicated: Secondary | ICD-10-CM | POA: Diagnosis not present

## 2017-08-09 DIAGNOSIS — M79671 Pain in right foot: Secondary | ICD-10-CM | POA: Insufficient documentation

## 2017-08-09 DIAGNOSIS — Y92410 Unspecified street and highway as the place of occurrence of the external cause: Secondary | ICD-10-CM | POA: Diagnosis not present

## 2017-08-09 DIAGNOSIS — Y998 Other external cause status: Secondary | ICD-10-CM | POA: Diagnosis not present

## 2017-08-09 DIAGNOSIS — S199XXA Unspecified injury of neck, initial encounter: Secondary | ICD-10-CM | POA: Diagnosis present

## 2017-08-09 DIAGNOSIS — M79604 Pain in right leg: Secondary | ICD-10-CM | POA: Insufficient documentation

## 2017-08-09 DIAGNOSIS — G44319 Acute post-traumatic headache, not intractable: Secondary | ICD-10-CM | POA: Insufficient documentation

## 2017-08-09 DIAGNOSIS — R0789 Other chest pain: Secondary | ICD-10-CM | POA: Diagnosis not present

## 2017-08-09 DIAGNOSIS — Y939 Activity, unspecified: Secondary | ICD-10-CM | POA: Diagnosis not present

## 2017-08-09 DIAGNOSIS — S161XXA Strain of muscle, fascia and tendon at neck level, initial encounter: Secondary | ICD-10-CM | POA: Insufficient documentation

## 2017-08-09 MED ORDER — NAPROXEN 500 MG PO TABS: 500.0000 mg | ORAL_TABLET | Freq: Two times a day (BID) | ORAL | 0 refills | Status: DC

## 2017-08-09 MED ORDER — OXYCODONE-ACETAMINOPHEN 5-325 MG PO TABS: 1.0000 | ORAL_TABLET | Freq: Four times a day (QID) | ORAL | 0 refills | Status: DC | PRN

## 2017-08-09 MED ORDER — METHOCARBAMOL 500 MG PO TABS: ORAL_TABLET | ORAL | 0 refills | Status: DC

## 2017-08-09 MED ORDER — METHOCARBAMOL 500 MG PO TABS
1000.0000 mg | ORAL_TABLET | Freq: Once | ORAL | Status: AC
  Administered 2017-08-09: 1000 mg via ORAL
  Filled 2017-08-09: qty 2

## 2017-08-09 MED ORDER — OXYCODONE-ACETAMINOPHEN 5-325 MG PO TABS
1.0000 | ORAL_TABLET | Freq: Once | ORAL | Status: AC
  Administered 2017-08-09: 1 via ORAL
  Filled 2017-08-09: qty 1
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
## 2017-08-09 NOTE — ED Notes (Signed)
c-collar removed per [place], [place].

## 2018-04-17 ENCOUNTER — Emergency Department
Admission: EM | Admit: 2018-04-17 | Discharge: 2018-04-17 | Disposition: A | Discharge status: Home / Self Care | Payer: 59 | Attending: Emergency Medicine | Admitting: Emergency Medicine

## 2018-04-17 ENCOUNTER — Other Ambulatory Visit: Payer: Self-pay

## 2018-04-17 DIAGNOSIS — Z79899 Other long term (current) drug therapy: Secondary | ICD-10-CM | POA: Insufficient documentation

## 2018-04-17 DIAGNOSIS — N3090 Cystitis, unspecified without hematuria: Secondary | ICD-10-CM | POA: Insufficient documentation

## 2018-04-17 DIAGNOSIS — F17210 Nicotine dependence, cigarettes, uncomplicated: Secondary | ICD-10-CM | POA: Insufficient documentation

## 2018-04-17 LAB — URINALYSIS, COMPLETE (UACMP) WITH MICROSCOPIC
BILIRUBIN URINE: NEGATIVE
Glucose, UA: NEGATIVE mg/dL
KETONES UR: NEGATIVE mg/dL
Nitrite: NEGATIVE
PROTEIN: 100 mg/dL — AB
Specific Gravity, Urine: 1.017 (ref 1.005–1.030)
pH: 5 (ref 5.0–8.0)

## 2018-04-17 MED ORDER — CEPHALEXIN 500 MG PO CAPS
500.0000 mg | ORAL_CAPSULE | Freq: Once | ORAL | Status: AC
  Administered 2018-04-17: 500 mg via ORAL
  Filled 2018-04-17: qty 1

## 2018-04-17 MED ORDER — CEPHALEXIN 500 MG PO CAPS: 500.0000 mg | ORAL_CAPSULE | Freq: Three times a day (TID) | ORAL | 0 refills | Status: AC

## 2018-04-17 MED ORDER — PHENAZOPYRIDINE HCL 100 MG PO TABS
100.0000 mg | ORAL_TABLET | Freq: Three times a day (TID) | ORAL | 0 refills | Status: AC | PRN
Start: 2018-04-17 — End: 2018-04-19

## 2018-04-17 NOTE — ED Notes (Signed)
See triage note. Pt c/o sudden inability to maintain continence. 8/10 pain in vagina. 10/10 pressure/burning pain when pt urinates.

## 2018-04-17 NOTE — ED Triage Notes (Signed)
Pt reports that she started antibiotics last week for URI - since the 2nd day she has been having vaginal pain with blood in urine - frequent urination

## 2018-04-17 NOTE — ED Provider Notes (Signed)
[facility] Emergency Department Provider Note  ____________________________________________  Time seen: Approximately 6:50 PM  I have reviewed the triage vital signs and the nursing notes.   HISTORY  Chief Complaint Dysuria and Hematuria    HPI Traci Torres is a 57 y.o. female presents to the emergency department with dysuria, hematuria and increased urinary frequency for the past 4 to 5 days.  Patient reports that she had a viral URI like infection last week and was prescribed Augmentin.  After the first day of taking Augmentin, patient reports that she noticed dysuria.  She associated dysuria with Augmentin and stopped taking Augmentin.  Patient reports that her dysuria persisted and by the end of the week she started to develop hematuria with small clots.  Patient has had a urinary tract infection in the past.  Last urinary tract infection was a year ago.  Patient denies low back pain, nausea and fever.  No prior history of pyelonephritis or nephrolithiasis.  No alleviating measures have been attempted for dysuria.   Past Medical History:  Diagnosis Date  . Panic attacks     There are no active problems to display for this patient.   Past Surgical History:  Procedure Laterality Date  . ABDOMINAL HYSTERECTOMY    . CHOLECYSTECTOMY      Prior to Admission medications   Medication Sig Start Date End Date Taking? Authorizing Provider  cephALEXin (KEFLEX) 500 MG capsule Take 1 capsule (500 mg total) by mouth 3 (three) times daily for 7 days. 04/17/18 04/24/18  Orvil Feil, PA-C  methocarbamol (ROBAXIN) 500 MG tablet 1-2 tablets every 6 hours prn muscle spasms 08/09/17   Bridget Hartshorn L, PA-C  naproxen (NAPROSYN) 500 MG tablet Take 1 tablet (500 mg total) by mouth 2 (two) times daily with a meal. 08/09/17   Tommi Rumps, PA-C  oxyCODONE-acetaminophen (PERCOCET) 5-325 MG tablet Take 1 tablet by mouth every 6 (six) hours as needed for severe pain.  08/09/17   Tommi Rumps, PA-C  phenazopyridine (PYRIDIUM) 100 MG tablet Take 1 tablet (100 mg total) by mouth 3 (three) times daily as needed for up to 2 days for pain. 04/17/18 04/19/18  Orvil Feil, PA-C    Allergies Penicillins and Percocet [oxycodone-acetaminophen]  Family History  Problem Relation Age of Onset  . Heart failure Father     Social History Social History   Tobacco Use  . Smoking status: Current Every Day Smoker    Packs/day: 0.50    Types: Cigarettes  . Smokeless tobacco: Never Used  Substance Use Topics  . Alcohol use: Yes  . Drug use: No     Review of Systems  Constitutional: No fever/chills Eyes: No visual changes. No discharge ENT: No upper respiratory complaints. Cardiovascular: no chest pain. Respiratory: no cough. No SOB. Gastrointestinal: No abdominal pain.  No nausea, no vomiting.  No diarrhea.  No constipation. Genitourinary: Patient has dysuria, increased urinary frequency and hematuria. Musculoskeletal: Negative for musculoskeletal pain. Skin: Negative for rash, abrasions, lacerations, ecchymosis. Neurological: Negative for headaches, focal weakness or numbness.   ____________________________________________   PHYSICAL EXAM:  VITAL SIGNS: ED Triage Vitals [04/17/18 1635]  Enc Vitals Group     BP (!) 143/100     Pulse Rate (!) 112     Resp 15     Temp 98.5 F (36.9 C)     Temp Source Oral     SpO2 100 %     Weight 246 lb (111.6 kg)  Height 5\' 8"  (1.727 m)     Head Circumference      Peak Flow      Pain Score 10     Pain Loc      Pain Edu?      Excl. in GC?      Constitutional: Alert and oriented. Well appearing and in no acute distress. Eyes: Conjunctivae are normal. PERRL. EOMI. Head: Atraumatic. Cardiovascular: Normal rate, regular rhythm. Normal S1 and S2.  Good peripheral circulation. Respiratory: Normal respiratory effort without tachypnea or retractions. Lungs CTAB. Good air entry to the bases with no  decreased or absent breath sounds. Gastrointestinal: Bowel sounds 4 quadrants.  Patient has suprapubic tenderness to palpation.  No guarding or rigidity. No palpable masses. No distention. No CVA tenderness. Musculoskeletal: Full range of motion to all extremities. No gross deformities appreciated. Neurologic:  Normal speech and language. No gross focal neurologic deficits are appreciated.  Skin:  Skin is warm, dry and intact. No rash noted. Psychiatric: Mood and affect are normal. Speech and behavior are normal. Patient exhibits appropriate insight and judgement.   ____________________________________________   LABS (all labs ordered are listed, but only abnormal results are displayed)  Labs Reviewed  URINALYSIS, COMPLETE (UACMP) WITH MICROSCOPIC - Abnormal; Notable for the following components:      Result Value   Color, Urine YELLOW (*)    APPearance CLOUDY (*)    Hgb urine dipstick LARGE (*)    Protein, ur 100 (*)    Leukocytes, UA MODERATE (*)    RBC / HPF >50 (*)    WBC, UA >50 (*)    Bacteria, UA RARE (*)    All other components within normal limits   ____________________________________________  EKG   ____________________________________________  RADIOLOGY   No results found.  ____________________________________________    PROCEDURES  Procedure(s) performed:    Procedures    Medications - No data to display   ____________________________________________   INITIAL IMPRESSION / ASSESSMENT AND PLAN / ED COURSE  Pertinent labs & imaging results that were available during my care of the patient were reviewed by me and considered in my medical decision making (see chart for details).  Review of the [place] CSRS was performed in accordance of the [facility] prior to dispensing any controlled drugs.      Assessment and plan Cystitis Differential diagnosis originally included pyelonephritis versus cystitis.  Absence of fever, low back pain and nausea/vomiting  decreases suspicion for pyelonephritis.  Urinalysis was concerning for cystitis with blood, white blood cells and protein.  Patient was treated empirically with Keflex and Pyridium.  She was advised to follow-up with primary care this week to assess for symptomatic improvement.  All patient questions were answered.    ____________________________________________  FINAL CLINICAL IMPRESSION(S) / ED DIAGNOSES  Final diagnoses:  Cystitis      NEW MEDICATIONS STARTED DURING THIS VISIT:  ED Discharge Orders         Ordered    cephALEXin (KEFLEX) 500 MG capsule  3 times daily     04/17/18 1846    phenazopyridine (PYRIDIUM) 100 MG tablet  3 times daily PRN     04/17/18 1849              This chart was dictated using voice recognition software/Dragon. Despite best efforts to proofread, errors can occur which can change the meaning. Any change was purely unintentional.    Orvil FeilWoods, Jaclyn M, PA-C 04/17/18 1854    Jene EveryKinner, Robert, MD 04/17/18 2002

## 2018-04-29 ENCOUNTER — Emergency Department
Admission: EM | Admit: 2018-04-29 | Discharge: 2018-04-29 | Disposition: A | Discharge status: Home / Self Care | Payer: Self-pay | Attending: Emergency Medicine | Admitting: Emergency Medicine

## 2018-04-29 ENCOUNTER — Emergency Department: Payer: Self-pay

## 2018-04-29 ENCOUNTER — Other Ambulatory Visit: Payer: Self-pay

## 2018-04-29 DIAGNOSIS — Z79899 Other long term (current) drug therapy: Secondary | ICD-10-CM | POA: Insufficient documentation

## 2018-04-29 DIAGNOSIS — M659 Synovitis and tenosynovitis, unspecified: Secondary | ICD-10-CM

## 2018-04-29 DIAGNOSIS — M65841 Other synovitis and tenosynovitis, right hand: Secondary | ICD-10-CM | POA: Insufficient documentation

## 2018-04-29 DIAGNOSIS — F17210 Nicotine dependence, cigarettes, uncomplicated: Secondary | ICD-10-CM | POA: Insufficient documentation

## 2018-04-29 MED ORDER — NAPROXEN 500 MG PO TABS: 500.0000 mg | ORAL_TABLET | Freq: Two times a day (BID) | ORAL | Status: DC

## 2018-04-29 MED ORDER — TRAMADOL HCL 50 MG PO TABS: 50.0000 mg | ORAL_TABLET | Freq: Two times a day (BID) | ORAL | 0 refills | Status: DC | PRN

## 2018-04-29 NOTE — ED Notes (Signed)
See triage note. Pt reports swelling to R thumb starting yesterday that has spread down hand and to 2nd and 3rd fingers. Click/popping sensation noted to thumb distal knuckle when moved. Pt states she can't move thumb actively, only with passive movement.

## 2018-04-29 NOTE — Discharge Instructions (Signed)
Wear splint and follow-up with Ortho if no improvement in 3 days.

## 2018-04-29 NOTE — ED Triage Notes (Signed)
Pt reports pain to the right thumb, states that it is popping and swollen, denies any known injury

## 2018-04-29 NOTE — ED Provider Notes (Signed)
[facility] Emergency Department Provider Note   ____________________________________________   First MD Initiated Contact with Patient 04/29/18 1313     (approximate)  I have reviewed the triage vital signs and the nursing notes.   HISTORY  Chief Complaint Hand Pain    HPI Traci Torres is a 57 y.o. female patient presents with acute onset pain, edema, and decreased range of motion of the DPJ first digit right hand.  No provocative incident for complaint.  Patient state she can only flex the right- DIPJ manually with the opposite hand; there is a "popping sensation".  Past Medical History:  Diagnosis Date  . Panic attacks     There are no active problems to display for this patient.   Past Surgical History:  Procedure Laterality Date  . ABDOMINAL HYSTERECTOMY    . CHOLECYSTECTOMY      Prior to Admission medications   Medication Sig Start Date End Date Taking? Authorizing Provider  methocarbamol (ROBAXIN) 500 MG tablet 1-2 tablets every 6 hours prn muscle spasms 08/09/17   Bridget HartshornSummers, Rhonda L, PA-C  naproxen (NAPROSYN) 500 MG tablet Take 1 tablet (500 mg total) by mouth 2 (two) times daily with a meal. 08/09/17   Tommi RumpsSummers, Rhonda L, PA-C  naproxen (NAPROSYN) 500 MG tablet Take 1 tablet (500 mg total) by mouth 2 (two) times daily with a meal. 04/29/18   Joni ReiningSmith, Ronald K, PA-C  oxyCODONE-acetaminophen (PERCOCET) 5-325 MG tablet Take 1 tablet by mouth every 6 (six) hours as needed for severe pain. 08/09/17   Tommi RumpsSummers, Rhonda L, PA-C  traMADol (ULTRAM) 50 MG tablet Take 1 tablet (50 mg total) by mouth every 12 (twelve) hours as needed. 04/29/18   Joni ReiningSmith, Ronald K, PA-C    Allergies Penicillins and Percocet [oxycodone-acetaminophen]  Family History  Problem Relation Age of Onset  . Heart failure Father     Social History Social History   Tobacco Use  . Smoking status: Current Every Day Smoker    Packs/day: 0.50    Types: Cigarettes  . Smokeless  tobacco: Never Used  Substance Use Topics  . Alcohol use: Yes  . Drug use: No    Review of Systems Constitutional: No fever/chills Eyes: No visual changes. ENT: No sore throat. Cardiovascular: Denies chest pain. Respiratory: Denies shortness of breath. Gastrointestinal: No abdominal pain.  No nausea, no vomiting.  No diarrhea.  No constipation. Genitourinary: Negative for dysuria. Musculoskeletal: Right thumb pain. Skin: Negative for rash. Neurological: Negative for headaches, focal weakness or numbness. Psychiatric:Anxiety. Allergic/Immunilogical: Penicillin Percocets. ____________________________________________   PHYSICAL EXAM:  VITAL SIGNS: ED Triage Vitals [04/29/18 1311]  Enc Vitals Group     BP 125/84     Pulse Rate 72     Resp 18     Temp 97.6 F (36.4 C)     Temp Source Oral     SpO2 99 %     Weight 244 lb (110.7 kg)     Height 5\' 8"  (1.727 m)     Head Circumference      Peak Flow      Pain Score 9     Pain Loc      Pain Edu?      Excl. in GC?     Constitutional: Alert and oriented. Well appearing and in no acute distress. Cardiovascular: Normal rate, regular rhythm. Grossly normal heart sounds.  Good peripheral circulation. Respiratory: Normal respiratory effort.  No retractions. Lungs CTAB. Gastrointestinal: Soft and nontender. No Musculoskeletal: No obvious  deformity to the left thumb.  Mild edema.  Decreased range of motion with flexion of the distal phalanx. Neurologic:  Normal speech and language. No gross focal neurologic deficits are appreciated. No gait instability. Skin:  Skin is warm, dry and intact. No rash noted. Psychiatric: Mood and affect are normal. Speech and behavior are normal.  ____________________________________________   LABS (all labs ordered are listed, but only abnormal results are displayed)  Labs Reviewed - No data to  display ____________________________________________  EKG   ____________________________________________  RADIOLOGY  ED MD interpretation:    Official radiology report(s): Dg Finger Thumb Right  Result Date: 04/29/2018 CLINICAL DATA:  Acute right thumb pain without reported injury. EXAM: RIGHT THUMB 2+V COMPARISON:  None. FINDINGS: There is no evidence of fracture or dislocation. Mild narrowing of the first carpometacarpal joint is noted. 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 No acute abnormality seen in the right thumb. Electronically Signed   By: Lupita RaiderJames  Green Jr, M.D.   On: 04/29/2018 13:49    ____________________________________________   PROCEDURES  Procedure(s) performed: None  Procedures  Critical Care performed: No  ____________________________________________   INITIAL IMPRESSION / ASSESSMENT AND PLAN / ED COURSE  As part of my medical decision making, I reviewed the following data within the electronic MEDICAL RECORD NUMBER    Thumb pain edema secondary to inflammatory process and mild osteoarthritis.  Patient placed in a thumb spica and advised to wear for 2 to 3 days.  Advised to follow orthopedic if no improvement in 3 days.  Take medication as directed.      ____________________________________________   FINAL CLINICAL IMPRESSION(S) / ED DIAGNOSES  Final diagnoses:  Tenosynovitis of finger     ED Discharge Orders         Ordered    naproxen (NAPROSYN) 500 MG tablet  2 times daily with meals     04/29/18 1415    traMADol (ULTRAM) 50 MG tablet  Every 12 hours PRN     04/29/18 1415           Note:  This document was prepared using Dragon voice recognition software and may include unintentional dictation errors.    Joni ReiningSmith, Ronald K, PA-C 04/29/18 1419    Emily FilbertWilliams, Jonathan E, MD 04/29/18 [id number]

## 2019-04-12 ENCOUNTER — Emergency Department
Admission: EM | Admit: 2019-04-12 | Discharge: 2019-04-12 | Disposition: A | Discharge status: Home / Self Care | Payer: 59 | Attending: Emergency Medicine | Admitting: Emergency Medicine

## 2019-04-12 ENCOUNTER — Emergency Department: Payer: 59

## 2019-04-12 ENCOUNTER — Encounter: Payer: Self-pay | Admitting: Emergency Medicine

## 2019-04-12 ENCOUNTER — Other Ambulatory Visit: Payer: Self-pay

## 2019-04-12 DIAGNOSIS — U071 COVID-19: Secondary | ICD-10-CM | POA: Diagnosis not present

## 2019-04-12 DIAGNOSIS — R42 Dizziness and giddiness: Secondary | ICD-10-CM | POA: Diagnosis not present

## 2019-04-12 DIAGNOSIS — F17210 Nicotine dependence, cigarettes, uncomplicated: Secondary | ICD-10-CM | POA: Insufficient documentation

## 2019-04-12 DIAGNOSIS — Z79899 Other long term (current) drug therapy: Secondary | ICD-10-CM | POA: Insufficient documentation

## 2019-04-12 DIAGNOSIS — R39198 Other difficulties with micturition: Secondary | ICD-10-CM | POA: Diagnosis not present

## 2019-04-12 DIAGNOSIS — R0602 Shortness of breath: Secondary | ICD-10-CM | POA: Insufficient documentation

## 2019-04-12 DIAGNOSIS — R531 Weakness: Secondary | ICD-10-CM | POA: Diagnosis present

## 2019-04-12 LAB — HEPATIC FUNCTION PANEL
ALT: 23 U/L (ref 0–44)
AST: 32 U/L (ref 15–41)
Albumin: 3.9 g/dL (ref 3.5–5.0)
Alkaline Phosphatase: 69 U/L (ref 38–126)
Bilirubin, Direct: <0.1 mg/dL (ref 0.0–0.2)
Total Bilirubin: 0.5 mg/dL (ref 0.3–1.2)
Total Protein: 8.1 g/dL (ref 6.5–8.1)

## 2019-04-12 LAB — CBC WITH DIFFERENTIAL/PLATELET
Abs Immature Granulocytes: 0.02 10*3/uL (ref 0.00–0.07)
Basophils Absolute: 0 10*3/uL (ref 0.0–0.1)
Basophils Relative: 1 %
Eosinophils Absolute: 0 10*3/uL (ref 0.0–0.5)
Eosinophils Relative: 1 %
HCT: 43.5 % (ref 36.0–46.0)
Hemoglobin: 14.6 g/dL (ref 12.0–15.0)
Immature Granulocytes: 0 %
Lymphocytes Relative: 28 %
Lymphs Abs: 1.8 10*3/uL (ref 0.7–4.0)
MCH: 28.1 pg (ref 26.0–34.0)
MCHC: 33.6 g/dL (ref 30.0–36.0)
MCV: 83.7 fL (ref 80.0–100.0)
Monocytes Absolute: 0.5 10*3/uL (ref 0.1–1.0)
Monocytes Relative: 8 %
Neutro Abs: 4 10*3/uL (ref 1.7–7.7)
Neutrophils Relative %: 62 %
Platelets: 188 10*3/uL (ref 150–400)
RBC: 5.2 MIL/uL — ABNORMAL HIGH (ref 3.87–5.11)
RDW: 13.1 % (ref 11.5–15.5)
WBC: 6.4 10*3/uL (ref 4.0–10.5)
nRBC: 0 % (ref 0.0–0.2)

## 2019-04-12 LAB — BASIC METABOLIC PANEL
Anion gap: 13 (ref 5–15)
BUN: 16 mg/dL (ref 6–20)
CO2: 21 mmol/L — ABNORMAL LOW (ref 22–32)
Calcium: 9 mg/dL (ref 8.9–10.3)
Chloride: 100 mmol/L (ref 98–111)
Creatinine, Ser: 1.03 mg/dL — ABNORMAL HIGH (ref 0.44–1.00)
GFR calc Af Amer: >60 mL/min (ref >60)
GFR calc non Af Amer: 60 mL/min — ABNORMAL LOW (ref >60)
Glucose, Bld: 109 mg/dL — ABNORMAL HIGH (ref 70–99)
Potassium: 3.9 mmol/L (ref 3.5–5.1)
Sodium: 134 mmol/L — ABNORMAL LOW (ref 135–145)

## 2019-04-12 LAB — TROPONIN I (HIGH SENSITIVITY): Troponin I (High Sensitivity): 3 ng/L (ref <18)

## 2019-04-12 MED ORDER — ONDANSETRON HCL 4 MG/2ML IJ SOLN
4.0000 mg | Freq: Once | INTRAMUSCULAR | Status: AC
  Administered 2019-04-12: 4 mg via INTRAVENOUS
  Filled 2019-04-12: qty 2

## 2019-04-12 MED ORDER — AZITHROMYCIN 250 MG PO TABS: ORAL_TABLET | ORAL | 0 refills | Status: DC

## 2019-04-12 MED ORDER — ACETAMINOPHEN 500 MG PO TABS
ORAL_TABLET | ORAL | Status: AC
  Filled 2019-04-12: qty 2

## 2019-04-12 MED ORDER — ACETAMINOPHEN 500 MG PO TABS
1000.0000 mg | ORAL_TABLET | Freq: Once | ORAL | Status: AC
  Administered 2019-04-12: 1000 mg via ORAL

## 2019-04-12 MED ORDER — ONDANSETRON 4 MG PO TBDP: 4.0000 mg | ORAL_TABLET | Freq: Three times a day (TID) | ORAL | 0 refills | Status: DC | PRN

## 2019-04-12 MED ORDER — DEXAMETHASONE SODIUM PHOSPHATE 10 MG/ML IJ SOLN
10.0000 mg | Freq: Once | INTRAMUSCULAR | Status: AC
  Administered 2019-04-12: 10 mg via INTRAVENOUS
  Filled 2019-04-12: qty 1

## 2019-04-12 MED ORDER — SODIUM CHLORIDE 0.9 % IV BOLUS
1000.0000 mL | Freq: Once | INTRAVENOUS | Status: AC
  Administered 2019-04-12: 1000 mL via INTRAVENOUS

## 2019-04-12 MED ORDER — PREDNISONE 10 MG (21) PO TBPK: ORAL_TABLET | ORAL | 0 refills | Status: DC

## 2019-04-12 NOTE — ED Notes (Signed)
Pt ambulated around room by this tech. While ambulating, patient maintained a pulse O2 of 95% throughout the duration of the walk. Pt ambulated back to bed with no signs of respiratory distress.

## 2019-04-12 NOTE — ED Triage Notes (Signed)
Pt dx with covid 1 week ago per her report.  Reports still having all the covid sx.  Explained they will last this long and when asked what sx brought her to the ED today, reports feeling dizzy (room spinning), did not have bowel movement until today, and feels like not urinating enough.  Alert and oriented. NAD. VSS, low ST

## 2019-04-12 NOTE — ED Notes (Signed)
Pt reporting new onset of a headache. RN asked MD about tylenol. Pt also reporting she has found a ride and feels safe with discharge after tylenol.

## 2019-04-12 NOTE — ED Notes (Signed)
Pt states she was dx with COVID 10 days ago, presents to ED because she has started to feel worse. NAD noted at this time. Pt given ice water and warm blanket.

## 2019-04-12 NOTE — ED Provider Notes (Signed)
[facility] Emergency Department Provider Note  ____________________________________________   First MD Initiated Contact with Patient 04/12/19 1704     (approximate)  I have reviewed the triage vital signs and the nursing notes.   HISTORY  Chief Complaint COVID sx and Dizziness    HPI Traci Torres is a 58 y.o. female presents emergency department complaining of worsening Covid symptoms.  She states she has been very weak.  She has not eaten in approximately 8 days.  She crawled to the refrigerator to get some Gatorade and thus what kept her from passing out.  She states she has had some shortness of breath.  Dizzy when she stands.  Feels like she has had decreased urination.    Past Medical History:  Diagnosis Date   Panic attacks     There are no active problems to display for this patient.   Past Surgical History:  Procedure Laterality Date   ABDOMINAL HYSTERECTOMY     CHOLECYSTECTOMY      Prior to Admission medications   Medication Sig Start Date End Date Taking? Authorizing Provider  atorvastatin (LIPITOR) 20 MG tablet Take 20 mg by mouth daily.    [provider]  azithromycin (ZITHROMAX Z-PAK) 250 MG tablet 2 pills today then 1 pill a day for 4 days 04/12/19   Caryn Section Linden Dolin, PA-C  metFORMIN (GLUCOPHAGE) 500 MG tablet Take 500 mg by mouth 2 (two) times daily with a meal.    [provider]  ondansetron (ZOFRAN-ODT) 4 MG disintegrating tablet Take 1 tablet (4 mg total) by mouth every 8 (eight) hours as needed. 04/12/19   Markon Jares, Linden Dolin, PA-C  predniSONE (STERAPRED UNI-PAK 21 TAB) 10 MG (21) TBPK tablet Take 6 pills on day one then decrease by 1 pill each day 04/12/19   Versie Starks, PA-C    Allergies Coconut flavor  Family History  Problem Relation Age of Onset   Heart failure Father     Social History Social History   Tobacco Use   Smoking status: Current Every Day Smoker    Packs/day: 0.50    Types:  Cigarettes   Smokeless tobacco: Never Used  Substance Use Topics   Alcohol use: Yes   Drug use: No    Review of Systems  Constitutional: No fever/chills, weakness Eyes: No visual changes. ENT: No sore throat. Respiratory: Denies cough, shortness of breath Genitourinary: Negative for dysuria. Musculoskeletal: Negative for back pain. Skin: Negative for rash.    ____________________________________________   PHYSICAL EXAM:  VITAL SIGNS: ED Triage Vitals [04/12/19 1619]  Enc Vitals Group     BP 108/74     Pulse Rate (!) 115     Resp 20     Temp 98.7 F (37.1 C)     Temp Source Oral     SpO2 96 %     Weight 240 lb (108.9 kg)     Height 5\' 8"  (1.727 m)     Head Circumference      Peak Flow      Pain Score 0     Pain Loc      Pain Edu?      Excl. in [place]?     Constitutional: Alert and oriented. Well appearing and in no acute distress. Eyes: Conjunctivae are normal.  Head: Atraumatic. Nose: No congestion/rhinnorhea. Mouth/Throat: Mucous membranes are moist.   Neck:  supple no lymphadenopathy noted Cardiovascular: Tachycardic, regular rhythm. Heart sounds are normal Respiratory: Normal respiratory effort.  No retractions, lungs c t a  Abd: soft nontender bs normal all 4 quad GU: deferred Musculoskeletal: FROM all extremities, warm and well perfused Neurologic:  Normal speech and language.  Skin:  Skin is warm, dry and intact. No rash noted. Psychiatric: Mood and affect are normal. Speech and behavior are normal.  ____________________________________________   LABS (all labs ordered are listed, but only abnormal results are displayed)  Labs Reviewed  BASIC METABOLIC PANEL - Abnormal; Notable for the following components:      Result Value   Sodium 134 (*)    CO2 21 (*)    Glucose, Bld 109 (*)    Creatinine, Ser 1.03 (*)    GFR calc non Af Amer 60 (*)    All other components within normal limits  CBC WITH DIFFERENTIAL/PLATELET - Abnormal; Notable for the  following components:   RBC 5.20 (*)    All other components within normal limits  HEPATIC FUNCTION PANEL  TROPONIN I (HIGH SENSITIVITY)  TROPONIN I (HIGH SENSITIVITY)   ____________________________________________   ____________________________________________  RADIOLOGY  Chest x-ray shows a questionable pneumonia on the right lower lung  ____________________________________________   PROCEDURES  Procedure(s) performed: Normal saline 1 L IV, repeated saline 1 L, Zofran 4 mg IV, Decadron 10 mg IV  Procedures    ____________________________________________   INITIAL IMPRESSION / ASSESSMENT AND PLAN / ED COURSE  Pertinent labs & imaging results that were available during my care of the patient were reviewed by me and considered in my medical decision making (see chart for details).   Patient is 58 year old female presents emergency department complaining of worsening Covid symptoms.  See HPI  Physical exam shows patient to appear tired.  A little short of breath while talking.  She is not short of breath when not talking.  Lungs are clear to auscultation.  Patient still tachycardic.  Remainder exams are unremarkable  CBC has a decreased sodium of 134, glucose at 109, creatinine is elevated at 1.03 with a decreased GFR of 60, CBC is basically normal,    ----------------------------------------- 6:40 PM on 04/12/2019 -----------------------------------------  Patient still feels dehydrated after the first bag of fluids.  So we will repeat another bag of fluids.  She will not be able to get her medications till tomorrow so I ordered Zofran 4 mg IV, Decadron 10 mg IV along with a second bag of normal saline.  During ambulation her pulse ox remained at 95%.  I feel that she is just not eating and drinking enough.  Had a long discussion about how she does need to eat to keep her strength up this is how she will fight the virus.  If she is worsening she is to return emergency  department.  She states she understands will comply.  She is discharged in stable condition.  Traci Torres was evaluated in Emergency Department on 04/12/2019 for the symptoms described in the history of present illness. She was evaluated in the context of the global COVID-19 pandemic, which necessitated consideration that the patient might be at risk for infection with the SARS-CoV-2 virus that causes COVID-19. Institutional protocols and algorithms that pertain to the evaluation of patients at risk for COVID-19 are in a state of rapid change based on information released by regulatory bodies including the CDC and federal and state organizations. These policies and algorithms were followed during the patient's care in the ED.   As part of my medical decision making, I reviewed the following data within the  electronic MEDICAL RECORD NUMBER Nursing notes reviewed and incorporated, Labs reviewed , EKG interpreted tachycardia, Old chart reviewed, Radiograph reviewed , Notes from prior ED visits and [place] Controlled Substance Database  ____________________________________________   FINAL CLINICAL IMPRESSION(S) / ED DIAGNOSES  Final diagnoses:  Dizziness  COVID-19      NEW MEDICATIONS STARTED DURING THIS VISIT:  New Prescriptions   AZITHROMYCIN (ZITHROMAX Z-PAK) 250 MG TABLET    2 pills today then 1 pill a day for 4 days   ONDANSETRON (ZOFRAN-ODT) 4 MG DISINTEGRATING TABLET    Take 1 tablet (4 mg total) by mouth every 8 (eight) hours as needed.   PREDNISONE (STERAPRED UNI-PAK 21 TAB) 10 MG (21) TBPK TABLET    Take 6 pills on day one then decrease by 1 pill each day     Note:  This document was prepared using Dragon voice recognition software and may include unintentional dictation errors.    Faythe GheeFisher, Muneeb Veras W, PA-C 04/12/19 1842    Minna AntisPaduchowski, Kevin, MD 04/12/19 2047

## 2019-04-12 NOTE — ED Notes (Signed)
Xray at bedside. Lab confirmed they had enough blood to add troponin to previous blood collection.

## 2019-04-12 NOTE — ED Notes (Signed)
Pt able to ambulate to the bathroom without increased difficulty. Pt resting in bed but reported to RN that she does not know how she will be getting home as there is no family that could come to the hospital to get her. Pt reporting she will try to get an UBER to take her home.

## 2019-04-12 NOTE — Discharge Instructions (Signed)
Follow-up with your regular doctor in 2 days if not improving.  Return emergency department worsening.  Use the Zithromax and Sterapred as prescribed.  Zofran for nausea as needed.  You do need to try to eat.  You will need substance to give you energy.  You should remain out of work until you have not had a fever for 24 hours, symptoms have improved, and have been quarantined for at least 10 days.

## 2019-05-31 ENCOUNTER — Inpatient Hospital Stay
Admission: RE | Admit: 2019-05-31 | Discharge: 2019-05-31 | Disposition: A | Discharge status: Home / Self Care | Payer: Self-pay | Source: Ambulatory Visit | Attending: *Deleted | Admitting: *Deleted

## 2019-05-31 ENCOUNTER — Other Ambulatory Visit: Payer: Self-pay | Admitting: *Deleted

## 2019-05-31 DIAGNOSIS — Z1231 Encounter for screening mammogram for malignant neoplasm of breast: Secondary | ICD-10-CM

## 2020-06-06 ENCOUNTER — Ambulatory Visit: Payer: 59 | Admitting: Dermatology

## 2020-07-01 ENCOUNTER — Ambulatory Visit (INDEPENDENT_AMBULATORY_CARE_PROVIDER_SITE_OTHER): Payer: BC Managed Care – PPO | Admitting: Dermatology

## 2020-07-01 ENCOUNTER — Other Ambulatory Visit: Payer: Self-pay

## 2020-07-01 DIAGNOSIS — L80 Vitiligo: Secondary | ICD-10-CM

## 2020-07-01 MED ORDER — OPZELURA 1.5 % EX CREA: 1.0000 "application " | TOPICAL_CREAM | Freq: Two times a day (BID) | CUTANEOUS | 2 refills | Status: DC

## 2020-07-01 NOTE — Patient Instructions (Signed)
Reviewed chronic nature, no cure and can be difficult to treat.  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  Treatments include topical steroids and other topical anti-inflammatory ointments/creams.  Sometimes narrow band UV light therapy or Xtrac laser is helpful, both of which require twice weekly treatments for at least 3-6 months.  Antioxidant vitamins, such as Vitamins C&E, and alpha lipoic acid may be added to enhance treatment.

## 2020-07-01 NOTE — Progress Notes (Signed)
   New Patient Visit  Subjective  Traci Torres is a 60 y.o. female who presents for the following: Vitiligo (Patient here today for vitiligo at face. ).  The following portions of the chart were reviewed this encounter and updated as appropriate:   Tobacco  Allergies  Meds  Problems  Med Hx  Surg Hx  Fam Hx     Review of Systems:  No other skin or systemic complaints except as noted in HPI or Assessment and Plan.  Objective  Well appearing patient in no apparent distress; mood and affect are within normal limits.  A focused examination was performed including face, hands. Relevant physical exam findings are noted in the Assessment and Plan.  Objective  face, hands, vaginal area: Depigmented macules at hands, face  Images                           Assessment & Plan  Vitiligo face, hands, vaginal area Start opzelura twice daily to affected areas.  Start oral Vitamin A (alpha lipoic acid), C, E daily  Xtrac laser twice a week if insurance approves (may consider NBUVB treatments if Xtrac not approved).  Reviewed chronic nature, no cure and can be difficult to treat.  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  Treatments include topical steroids and other topical anti-inflammatory ointments/creams.  Sometimes narrow band UV light therapy or Xtrac laser is helpful, both of which require twice weekly treatments for at least 3-6 months.  Antioxidant vitamins, such as Vitamins C&E, and alpha lipoic acid may be added to enhance treatment.  Will submit for Xtrac approval.   Ordered Medications: Ruxolitinib Phosphate (OPZELURA) 1.5 % CREA  Return in about 3 months (around 09/28/2020) for vitiligo.  Anise Salvo, RMA, am acting as scribe for Armida Sans, MD . Documentation: I have reviewed the above documentation  for accuracy and completeness, and I agree with the above.  Armida Sans, MD

## 2020-07-02 ENCOUNTER — Encounter: Payer: Self-pay | Admitting: Dermatology

## 2020-07-10 ENCOUNTER — Other Ambulatory Visit: Payer: Self-pay

## 2020-07-10 ENCOUNTER — Emergency Department
Admission: EM | Admit: 2020-07-10 | Discharge: 2020-07-10 | Disposition: A | Discharge status: Home / Self Care | Payer: BC Managed Care – PPO | Attending: Student in an Organized Health Care Education/Training Program | Admitting: Student in an Organized Health Care Education/Training Program

## 2020-07-10 ENCOUNTER — Emergency Department: Payer: BC Managed Care – PPO

## 2020-07-10 DIAGNOSIS — R1011 Right upper quadrant pain: Secondary | ICD-10-CM | POA: Insufficient documentation

## 2020-07-10 DIAGNOSIS — F17210 Nicotine dependence, cigarettes, uncomplicated: Secondary | ICD-10-CM | POA: Diagnosis not present

## 2020-07-10 DIAGNOSIS — R110 Nausea: Secondary | ICD-10-CM | POA: Insufficient documentation

## 2020-07-10 DIAGNOSIS — R1013 Epigastric pain: Secondary | ICD-10-CM | POA: Diagnosis not present

## 2020-07-10 LAB — COMPREHENSIVE METABOLIC PANEL
ALT: 14 U/L (ref 0–44)
AST: 20 U/L (ref 15–41)
Albumin: 4 g/dL (ref 3.5–5.0)
Alkaline Phosphatase: 84 U/L (ref 38–126)
Anion gap: 6 (ref 5–15)
BUN: 10 mg/dL (ref 6–20)
CO2: 23 mmol/L (ref 22–32)
Calcium: 9.4 mg/dL (ref 8.9–10.3)
Chloride: 107 mmol/L (ref 98–111)
Creatinine, Ser: 0.63 mg/dL (ref 0.44–1.00)
GFR, Estimated: >60 mL/min (ref >60)
Glucose, Bld: 115 mg/dL — ABNORMAL HIGH (ref 70–99)
Potassium: 4 mmol/L (ref 3.5–5.1)
Sodium: 136 mmol/L (ref 135–145)
Total Bilirubin: 0.6 mg/dL (ref 0.3–1.2)
Total Protein: 7.5 g/dL (ref 6.5–8.1)

## 2020-07-10 LAB — URINALYSIS, COMPLETE (UACMP) WITH MICROSCOPIC
Bacteria, UA: NONE SEEN
Bilirubin Urine: NEGATIVE
Glucose, UA: NEGATIVE mg/dL
Hgb urine dipstick: NEGATIVE
Ketones, ur: NEGATIVE mg/dL
Leukocytes,Ua: NEGATIVE
Nitrite: NEGATIVE
Protein, ur: NEGATIVE mg/dL
Specific Gravity, Urine: 1.023 (ref 1.005–1.030)
pH: 5 (ref 5.0–8.0)

## 2020-07-10 LAB — CBC
HCT: 42.5 % (ref 36.0–46.0)
Hemoglobin: 13.7 g/dL (ref 12.0–15.0)
MCH: 28.6 pg (ref 26.0–34.0)
MCHC: 32.2 g/dL (ref 30.0–36.0)
MCV: 88.7 fL (ref 80.0–100.0)
Platelets: 221 10*3/uL (ref 150–400)
RBC: 4.79 MIL/uL (ref 3.87–5.11)
RDW: 13.9 % (ref 11.5–15.5)
WBC: 11.7 10*3/uL — ABNORMAL HIGH (ref 4.0–10.5)
nRBC: 0 % (ref 0.0–0.2)

## 2020-07-10 LAB — LIPASE, BLOOD: Lipase: 116 U/L — ABNORMAL HIGH (ref 11–51)

## 2020-07-10 MED ORDER — IOHEXOL 300 MG/ML  SOLN
125.0000 mL | Freq: Once | INTRAMUSCULAR | Status: AC | PRN
  Administered 2020-07-10: 125 mL via INTRAVENOUS

## 2020-07-10 MED ORDER — MORPHINE SULFATE (PF) 4 MG/ML IV SOLN
4.0000 mg | INTRAVENOUS | Status: DC | PRN
  Administered 2020-07-10: 4 mg via INTRAVENOUS
  Filled 2020-07-10: qty 1

## 2020-07-10 MED ORDER — HYDROCODONE-ACETAMINOPHEN 5-325 MG PO TABS: 1.0000 | ORAL_TABLET | Freq: Four times a day (QID) | ORAL | 0 refills | Status: DC | PRN

## 2020-07-10 MED ORDER — SODIUM CHLORIDE 0.9 % IV BOLUS
500.0000 mL | Freq: Once | INTRAVENOUS | Status: AC
  Administered 2020-07-10: 500 mL via INTRAVENOUS

## 2020-07-10 MED ORDER — ONDANSETRON HCL 4 MG/2ML IJ SOLN
4.0000 mg | Freq: Once | INTRAMUSCULAR | Status: AC
  Administered 2020-07-10: 4 mg via INTRAVENOUS
  Filled 2020-07-10: qty 2

## 2020-07-10 MED ORDER — ONDANSETRON HCL 4 MG PO TABS: 4.0000 mg | ORAL_TABLET | Freq: Every day | ORAL | 0 refills | Status: AC | PRN

## 2020-07-10 MED ORDER — KETOROLAC TROMETHAMINE 30 MG/ML IJ SOLN
15.0000 mg | Freq: Once | INTRAMUSCULAR | Status: AC
  Administered 2020-07-10: 15 mg via INTRAVENOUS
  Filled 2020-07-10: qty 1

## 2020-07-10 MED ORDER — OXYCODONE-ACETAMINOPHEN 5-325 MG PO TABS
1.0000 | ORAL_TABLET | Freq: Once | ORAL | Status: AC
  Administered 2020-07-10: 1 via ORAL
  Filled 2020-07-10: qty 1

## 2020-07-10 NOTE — ED Notes (Signed)
Pt given ice water and encouraged to drink fluid at this time.

## 2020-07-10 NOTE — Discharge Instructions (Addendum)

## 2020-07-10 NOTE — ED Notes (Signed)
Pt reports abd pain for 1 week. Pt states she has nausea.  No v/d.  Pt states pain is worse today.  Pt also has back pain.  No otc meds today for discomfort.   Pt denies chest pain or sob.  Pt alert, speech clear.

## 2020-07-10 NOTE — ED Triage Notes (Signed)
Pt states a week ago she started having a stomach ache, pt states yesterday the pain got worse "feels like im having contractions". Pt states she feels nauseous denies v/d. Pt also states she has had decrease in urination.

## 2020-07-10 NOTE — ED Provider Notes (Signed)
[facility] Emergency Department Provider Note    Event Date/Time   First MD Initiated Contact with Patient 07/10/20 [phone]     (approximate)  I have reviewed the triage vital signs and the nursing notes.   HISTORY  Chief Complaint Abdominal Pain    HPI Traci Torres is a 60 y.o. female below listed past medical history status post cholecystectomy presents to the ER for few days of progressively worsening was initially epigastric pain now right being to the right upper quadrant.  Is having nausea.  No measured fevers.  No other radiation of pain.  Is never had pain like this before.  Denies any diarrhea.  Denies any chest pain or shortness of breath.    Past Medical History:  Diagnosis Date  . Panic attacks    Family History  Problem Relation Age of Onset  . Heart failure Father    Past Surgical History:  Procedure Laterality Date  . ABDOMINAL HYSTERECTOMY    . CHOLECYSTECTOMY     There are no problems to display for this patient.     Prior to Admission medications   Medication Sig Start Date End Date Taking? Authorizing Provider  HYDROcodone-acetaminophen (NORCO) 5-325 MG tablet Take 1 tablet by mouth every 6 (six) hours as needed for moderate pain. 07/10/20  Yes Willy Eddy, MD  ondansetron (ZOFRAN) 4 MG tablet Take 1 tablet (4 mg total) by mouth daily as needed. 07/10/20 07/10/21 Yes Willy Eddy, MD  atorvastatin (LIPITOR) 20 MG tablet Take 20 mg by mouth daily.    [provider]  azithromycin (ZITHROMAX Z-PAK) 250 MG tablet 2 pills today then 1 pill a day for 4 days Patient not taking: Reported on 07/01/2020 04/12/19   Faythe Ghee, PA-C  metFORMIN (GLUCOPHAGE) 500 MG tablet Take 500 mg by mouth 2 (two) times daily with a meal. Patient not taking: Reported on 07/01/2020    [provider]  ondansetron (ZOFRAN-ODT) 4 MG disintegrating tablet Take 1 tablet (4 mg total) by mouth every 8 (eight) hours as  needed. Patient not taking: Reported on 07/01/2020 04/12/19   Faythe Ghee, PA-C  predniSONE (STERAPRED UNI-PAK 21 TAB) 10 MG (21) TBPK tablet Take 6 pills on day one then decrease by 1 pill each day Patient not taking: Reported on 07/01/2020 04/12/19   Faythe Ghee, PA-C  Ruxolitinib Phosphate (OPZELURA) 1.5 % CREA Apply 1 application topically 2 (two) times daily. 07/01/20   Deirdre Evener, MD  VICTOZA 18 MG/3ML SOPN Inject 0.6 mg subcutaneously once a day  for 1 week then increase to 1.2mg  daily for diabetes Patient not taking: Reported on 07/01/2020 04/02/20   [provider]    Allergies Coconut flavor    Social History Social History   Tobacco Use  . Smoking status: Current Every Day Smoker    Packs/day: 0.50    Types: Cigarettes  . Smokeless tobacco: Never Used  Substance Use Topics  . Alcohol use: Yes  . Drug use: No    Review of Systems Patient denies headaches, rhinorrhea, blurry vision, numbness, shortness of breath, chest pain, edema, cough, abdominal pain, nausea, vomiting, diarrhea, dysuria, fevers, rashes or hallucinations unless otherwise stated above in HPI. ____________________________________________   PHYSICAL EXAM:  VITAL SIGNS: Vitals:   07/10/20 0500 07/10/20 0530  BP: 122/84 (!) 133/93  Pulse: 77 87  Resp: 15 16  Temp:    SpO2: 98% 94%    Constitutional: Alert and oriented.  Eyes: Conjunctivae  are normal.  Head: Atraumatic. Nose: No congestion/rhinnorhea. Mouth/Throat: Mucous membranes are moist.   Neck: No stridor. Painless ROM.  Cardiovascular: Normal rate, regular rhythm. Grossly normal heart sounds.  Good peripheral circulation. Respiratory: Normal respiratory effort.  No retractions. Lungs CTAB. Gastrointestinal: Soft with mild ttp in RUQ.   No distention. No abdominal bruits. No CVA tenderness. Genitourinary:  Musculoskeletal: No lower extremity tenderness nor edema.  No joint effusions. Neurologic:  Normal speech and  language. No gross focal neurologic deficits are appreciated. No facial droop Skin:  Skin is warm, dry and intact. No rash noted. Psychiatric: Mood and affect are normal. Speech and behavior are normal.  ____________________________________________   LABS (all labs ordered are listed, but only abnormal results are displayed)  Results for orders placed or performed during the hospital encounter of 07/10/20 (from the past 24 hour(s))  Lipase, blood     Status: Abnormal   Collection Time: 07/10/20  1:32 AM  Result Value Ref Range   Lipase 116 (H) 11 - 51 U/L  Comprehensive metabolic panel     Status: Abnormal   Collection Time: 07/10/20  1:32 AM  Result Value Ref Range   Sodium 136 135 - 145 mmol/L   Potassium 4.0 3.5 - 5.1 mmol/L   Chloride 107 98 - 111 mmol/L   CO2 23 22 - 32 mmol/L   Glucose, Bld 115 (H) 70 - 99 mg/dL   BUN 10 6 - 20 mg/dL   Creatinine, Ser 7.89 0.44 - 1.00 mg/dL   Calcium 9.4 8.9 - 38.1 mg/dL   Total Protein 7.5 6.5 - 8.1 g/dL   Albumin 4.0 3.5 - 5.0 g/dL   AST 20 15 - 41 U/L   ALT 14 0 - 44 U/L   Alkaline Phosphatase 84 38 - 126 U/L   Total Bilirubin 0.6 0.3 - 1.2 mg/dL   GFR, Estimated >01 >75 mL/min   Anion gap 6 5 - 15  CBC     Status: Abnormal   Collection Time: 07/10/20  1:32 AM  Result Value Ref Range   WBC 11.7 (H) 4.0 - 10.5 K/uL   RBC 4.79 3.87 - 5.11 MIL/uL   Hemoglobin 13.7 12.0 - 15.0 g/dL   HCT 10.2 58.5 - 27.7 %   MCV 88.7 80.0 - 100.0 fL   MCH 28.6 26.0 - 34.0 pg   MCHC 32.2 30.0 - 36.0 g/dL   RDW 82.4 23.5 - 36.1 %   Platelets 221 150 - 400 K/uL   nRBC 0.0 0.0 - 0.2 %  Urinalysis, Complete w Microscopic     Status: Abnormal   Collection Time: 07/10/20  1:32 AM  Result Value Ref Range   Color, Urine YELLOW (A) YELLOW   APPearance HAZY (A) CLEAR   Specific Gravity, Urine 1.023 1.005 - 1.030   pH 5.0 5.0 - 8.0   Glucose, UA NEGATIVE NEGATIVE mg/dL   Hgb urine dipstick NEGATIVE NEGATIVE   Bilirubin Urine NEGATIVE NEGATIVE    Ketones, ur NEGATIVE NEGATIVE mg/dL   Protein, ur NEGATIVE NEGATIVE mg/dL   Nitrite NEGATIVE NEGATIVE   Leukocytes,Ua NEGATIVE NEGATIVE   WBC, UA 0-5 0 - 5 WBC/hpf   Bacteria, UA NONE SEEN NONE SEEN   Squamous Epithelial / LPF 6-10 0 - 5   Mucus PRESENT    ____________________________________________  EKG My review and personal interpretation at Time: 5:09   Indication: abd pain  Rate: 70  Rhythm: sinus Axis: normal Other: nonspecific st abn, no stemi ____________________________________________  RADIOLOGY  I personally reviewed all radiographic images ordered to evaluate for the above acute complaints and reviewed radiology reports and findings.  These findings were personally discussed with the patient.  Please see medical record for radiology report.  ____________________________________________   PROCEDURES  Procedure(s) performed:  Procedures    Critical Care performed: no ____________________________________________   INITIAL IMPRESSION / ASSESSMENT AND PLAN / ED COURSE  Pertinent labs & imaging results that were available during my care of the patient were reviewed by me and considered in my medical decision making (see chart for details).   DDX: Enteritis, gastritis, pancreatitis, diverticulitis, SBO, dehydration, electrolyte abnormality  Traci Torres is a 60 y.o. who presents to the ED with presentation as described above.  On exam she does have some mild epigastric pain and right upper quadrant pain she status post cholecystectomy.  Blood work with mildly elevated lipase raising question for possible early pancreatitis though the patient is not a heavy drinker.  LFTs normal.  Based on her presenting symptoms will order CT imaging for the but differential will provide IV fluids as well as IV narcotic medication.  Clinical Course as of 07/10/20 0545  Wed Jul 10, 2020  [id number] Discussed results of CT imaging with the patient.  She not having any pelvic discomfort  therefore think that referral to OB/GYN will be appropriate.  Given her mildly elevated lipase may be having some early mild pancreatitis therefore will give additional pain medication and p.o. challenge to see the patient will be appropriate for outpatient follow-up.  She remains nontoxic-appearing.  Repeat abdominal exam benign. [PR]  0540 Patient reassessed.  She is tolerating p.o.  Pain controlled.  Do believe patient appropriate for trial of outpatient management will give referral to GI.  We discussed return precautions.  Patient agreeable to plan. [PR]    Clinical Course User Index [PR] Willy Eddy, MD    The patient was evaluated in Emergency Department today for the symptoms described in the history of present illness. He/she was evaluated in the context of the global COVID-19 pandemic, which necessitated consideration that the patient might be at risk for infection with the SARS-CoV-2 virus that causes COVID-19. Institutional protocols and algorithms that pertain to the evaluation of patients at risk for COVID-19 are in a state of rapid change based on information released by regulatory bodies including the CDC and federal and state organizations. These policies and algorithms were followed during the patient's care in the ED.  As part of my medical decision making, I reviewed the following data within the electronic MEDICAL RECORD NUMBER Nursing notes reviewed and incorporated, Labs reviewed, notes from prior ED visits and [place] Controlled Substance Database   ____________________________________________   FINAL CLINICAL IMPRESSION(S) / ED DIAGNOSES  Final diagnoses:  Epigastric pain      NEW MEDICATIONS STARTED DURING THIS VISIT:  New Prescriptions   HYDROCODONE-ACETAMINOPHEN (NORCO) 5-325 MG TABLET    Take 1 tablet by mouth every 6 (six) hours as needed for moderate pain.   ONDANSETRON (ZOFRAN) 4 MG TABLET    Take 1 tablet (4 mg total) by mouth daily as needed.     Note:   This document was prepared using Dragon voice recognition software and may include unintentional dictation errors.    Willy Eddy, MD 07/10/20 [phone]

## 2020-07-10 NOTE — ED Notes (Signed)
Pt unable to drive due to pain medication received, pt contacted friend for ride and waiting for them to arrive.

## 2020-09-18 ENCOUNTER — Ambulatory Visit: Payer: PRIVATE HEALTH INSURANCE | Admitting: Dermatology

## 2020-12-23 ENCOUNTER — Telehealth: Payer: Self-pay

## 2020-12-23 NOTE — Telephone Encounter (Signed)
Pt called to discuss Xtrac laser for vitiligo, pt has been calling for the last 2 months to get scheduled for Xtrac laser.   LM on VM please return my call, pt need a follow up appt

## 2020-12-23 NOTE — Telephone Encounter (Signed)
Phone number for peer to peer review is [phone] and the Ref# for call was [id number].

## 2020-12-23 NOTE — Telephone Encounter (Signed)
Patient's Xtrac laser was denied. [employer] does not cover vitiligo dosing for the Xtrac laser. Patient wants a peer to peer review done. I can not do that, only the MDs can discuss with insurance companies.

## 2020-12-23 NOTE — Telephone Encounter (Signed)
Called and placed request for peer to peer review today.

## 2020-12-30 ENCOUNTER — Other Ambulatory Visit: Payer: Self-pay

## 2020-12-30 ENCOUNTER — Ambulatory Visit (INDEPENDENT_AMBULATORY_CARE_PROVIDER_SITE_OTHER): Payer: BC Managed Care – PPO | Admitting: Dermatology

## 2020-12-30 DIAGNOSIS — L80 Vitiligo: Secondary | ICD-10-CM

## 2020-12-30 MED ORDER — OPZELURA 1.5 % EX CREA: 1.0000 "application " | TOPICAL_CREAM | Freq: Two times a day (BID) | CUTANEOUS | 3 refills | Status: AC

## 2020-12-30 NOTE — Patient Instructions (Addendum)
Vitamin A, C, E and Alphalipoic Acid - [facility] at [facility]  If you have any questions or concerns for your doctor, please call our main line at [phone] and press option 4 to reach your doctor's medical assistant. If no one answers, please leave a voicemail as directed and we will return your call as soon as possible. Messages left after 4 pm will be answered the following business day.   You may also send [place] a message via MyChart. We typically respond to MyChart messages within 1-2 business days.  For prescription refills, please ask your pharmacy to contact our office. Our fax number is [phone].  If you have an urgent issue when the clinic is closed that cannot wait until the next business day, you can page your doctor at the number below.    Please note that while we do our best to be available for urgent issues outside of office hours, we are not available 24/7.   If you have an urgent issue and are unable to reach [place], you may choose to seek medical care at your doctor's office, retail clinic, urgent care center, or emergency room.  If you have a medical emergency, please immediately call 911 or go to the emergency department.  Pager Numbers  - Dr. Gwen Pounds: [phone]  - Dr. Neale Burly: [phone]  - Dr. Roseanne Reno: [phone]  In the event of inclement weather, please call our main line at [phone] for an update on the status of any delays or closures.  Dermatology Medication Tips: Please keep the boxes that topical medications come in in order to help keep track of the instructions about where and how to use these. Pharmacies typically print the medication instructions only on the boxes and not directly on the medication tubes.   If your medication is too expensive, please contact our office at [phone] option 4 or send [place] a message through MyChart.   We are unable to tell what your co-pay for medications will be in advance as this is different  depending on your insurance coverage. However, we may be able to find a substitute medication at lower cost or fill out paperwork to get insurance to cover a needed medication.   If a prior authorization is required to get your medication covered by your insurance company, please allow [place] 1-2 business days to complete this process.  Drug prices often vary depending on where the prescription is filled and some pharmacies may offer cheaper prices.  The website www.goodrx.com contains coupons for medications through different pharmacies. The prices here do not account for what the cost may be with help from insurance (it may be cheaper with your insurance), but the website can give you the price if you did not use any insurance.  - You can print the associated coupon and take it with your prescription to the pharmacy.  - You may also stop by our office during regular business hours and pick up a GoodRx coupon card.  - If you need your prescription sent electronically to a different pharmacy, notify our office through [facility] or by phone at [phone] option 4.

## 2020-12-30 NOTE — Progress Notes (Signed)
   Follow-Up Visit   Subjective  Traci Torres is a 60 y.o. female who presents for the following: Follow-up (Vitiligo follow up of face, hands and vaginal area - Opzelura qd. Has not started vitamins yet - having a hard time finding them. Has not started Xtrac because it was denied by her insurance. She gets depressed about her condition because it is all over her face and she does not like taking her makeup off.).  The following portions of the chart were reviewed this encounter and updated as appropriate:   Tobacco  Allergies  Meds  Problems  Med Hx  Surg Hx  Fam Hx     Review of Systems:  No other skin or systemic complaints except as noted in HPI or Assessment and Plan.  Objective  Well appearing patient in no apparent distress; mood and affect are within normal limits.  A focused examination was performed including face, hands. Relevant physical exam findings are noted in the Assessment and Plan.  Head - Anterior (Face) Depigmented macules at hands, face          Assessment & Plan  Vitiligo Head - Anterior (Face)  Reviewed chronic nature, no cure and can be difficult to treat.  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  Treatments include topical steroids and other topical anti-inflammatory ointments/creams.  Sometimes narrow band UV light therapy or Xtrac laser is helpful, both of which require twice weekly treatments for at least 3-6 months.  Antioxidant vitamins, such as Vitamins C&E, and alpha lipoic acid may be added to enhance treatment.   Continue Opzelura cream bid Start Viatmin A, C, E and alphalipoic acid qd - advised patient she can get this at [facility]  Recommend [facility] laser treatment for affected areas of vitiligo.  The patient's insurance has denied XTRAC laser treatments.   There is a pending Peer to Peer review  for Xtrac treatments.  Related Medications Ruxolitinib Phosphate (OPZELURA) 1.5 % CREA Apply 1 application topically 2 (two) times daily.  Return in about 5 months (around 06/01/2021) for Follow up Vitiligo.  I, Joanie Coddington, CMA, am acting as scribe for Armida Sans, MD . Documentation: I have reviewed the above documentation for accuracy and completeness, and I agree with the above.  Armida Sans, MD

## 2020-12-31 ENCOUNTER — Encounter: Payer: Self-pay | Admitting: Dermatology

## 2021-05-28 ENCOUNTER — Ambulatory Visit: Payer: BC Managed Care – PPO | Admitting: Dermatology

## 2021-07-02 ENCOUNTER — Ambulatory Visit
Admit: 2021-07-02 | Discharge: 2021-07-02 | Disposition: A | Discharge status: Home / Self Care | Payer: BC Managed Care – PPO | Attending: Physician Assistant | Admitting: Physician Assistant

## 2021-07-02 ENCOUNTER — Ambulatory Visit
Admission: EM | Admit: 2021-07-02 | Discharge: 2021-07-02 | Disposition: A | Discharge status: Home / Self Care | Payer: BC Managed Care – PPO | Attending: Physician Assistant | Admitting: Physician Assistant

## 2021-07-02 ENCOUNTER — Encounter: Payer: Self-pay | Admitting: Emergency Medicine

## 2021-07-02 ENCOUNTER — Other Ambulatory Visit: Payer: Self-pay

## 2021-07-02 DIAGNOSIS — M79605 Pain in left leg: Secondary | ICD-10-CM | POA: Diagnosis not present

## 2021-07-02 DIAGNOSIS — M79604 Pain in right leg: Secondary | ICD-10-CM | POA: Insufficient documentation

## 2021-07-02 DIAGNOSIS — M7989 Other specified soft tissue disorders: Secondary | ICD-10-CM | POA: Diagnosis not present

## 2021-07-02 LAB — POCT URINALYSIS DIP (MANUAL ENTRY)
Bilirubin, UA: NEGATIVE
Blood, UA: NEGATIVE
Glucose, UA: NEGATIVE mg/dL
Ketones, POC UA: NEGATIVE mg/dL
Leukocytes, UA: NEGATIVE
Nitrite, UA: NEGATIVE
Protein Ur, POC: NEGATIVE mg/dL
Spec Grav, UA: 1.02 (ref 1.010–1.025)
Urobilinogen, UA: 0.2 E.U./dL
pH, UA: 5.5 (ref 5.0–8.0)

## 2021-07-02 NOTE — ED Provider Notes (Signed)
Roderic Palau    CSN: [id number] Arrival date & time: 07/02/21  [id number]      History   Chief Complaint Chief Complaint  Patient presents with   Foot Swelling    HPI Traci Torres is a 61 y.o. female.   Patient presents today with a 1 day history of bilateral lower extremity swelling and pain.  She reports that the swelling is worse on the left with associated pain going into her thigh.  If she is having difficulty walking as result of swelling and pain.  She denies any change in medication or dietary habits.  She denies history of chronic heart failure, chronic liver or kidney disease, thyroid condition.  She denies any associated shortness of breath, chest pain, palpitations, nausea, vomiting, weakness.  She denies history of VTE event denies recent COVID-19 infection, recent hospitalization, recent travel, recent immobilization, malignancy, exogenous hormone use.  She reports that pain is rated 6 on a 0-10 pain scale, localized to left leg, described as aching periodic shooting pains, no alleviating factors identified.  She has not been taking any over-the-counter medication for symptom management.   Past Medical History:  Diagnosis Date   Panic attacks     There are no problems to display for this patient.   Past Surgical History:  Procedure Laterality Date   ABDOMINAL HYSTERECTOMY     CHOLECYSTECTOMY      OB History   No obstetric history on file.      Home Medications    Prior to Admission medications   Medication Sig Start Date End Date Taking? Authorizing Provider  atorvastatin (LIPITOR) 20 MG tablet Take 20 mg by mouth daily.    [provider]  azithromycin (ZITHROMAX Z-PAK) 250 MG tablet 2 pills today then 1 pill a day for 4 days Patient not taking: Reported on 07/01/2020 04/12/19   Versie Starks, PA-C  HYDROcodone-acetaminophen (NORCO) 5-325 MG tablet Take 1 tablet by mouth every 6 (six) hours as needed for moderate pain. 07/10/20   Merlyn Lot, MD  metFORMIN (GLUCOPHAGE) 500 MG tablet Take 500 mg by mouth 2 (two) times daily with a meal. Patient not taking: Reported on 07/01/2020    [provider]  ondansetron (ZOFRAN) 4 MG tablet Take 1 tablet (4 mg total) by mouth daily as needed. 07/10/20 07/10/21  Merlyn Lot, MD  ondansetron (ZOFRAN-ODT) 4 MG disintegrating tablet Take 1 tablet (4 mg total) by mouth every 8 (eight) hours as needed. Patient not taking: Reported on 07/01/2020 04/12/19   Versie Starks, PA-C  predniSONE (STERAPRED UNI-PAK 21 TAB) 10 MG (21) TBPK tablet Take 6 pills on day one then decrease by 1 pill each day Patient not taking: Reported on 07/01/2020 04/12/19   Versie Starks, PA-C  Ruxolitinib Phosphate (OPZELURA) 1.5 % CREA Apply 1 application topically 2 (two) times daily. 12/30/20   Ralene Bathe, MD  VICTOZA 18 MG/3ML SOPN Inject 0.6 mg subcutaneously once a day  for 1 week then increase to 1.2mg  daily for diabetes Patient not taking: Reported on 07/01/2020 04/02/20   [provider]    Family History Family History  Problem Relation Age of Onset   Heart failure Father     Social History Social History   Tobacco Use   Smoking status: Every Day    Packs/day: 0.50    Types: Cigarettes   Smokeless tobacco: Never  Vaping Use   Vaping Use: Never used  Substance Use Topics   Alcohol  use: Yes   Drug use: No     Allergies   Coconut flavor   Review of Systems Review of Systems  Constitutional:  Positive for activity change. Negative for appetite change, fatigue and fever.  Respiratory:  Negative for cough and shortness of breath.   Cardiovascular:  Positive for leg swelling. Negative for chest pain and palpitations.  Gastrointestinal:  Negative for abdominal pain, diarrhea, nausea and vomiting.  Musculoskeletal:  Positive for myalgias. Negative for arthralgias.  Neurological:  Negative for dizziness, light-headedness and headaches.    Physical Exam Triage Vital  Signs ED Triage Vitals  Enc Vitals Group     BP 07/02/21 1125 (!) 149/89     Pulse Rate 07/02/21 1125 100     Resp 07/02/21 1125 18     Temp 07/02/21 1125 97.9 F (36.6 C)     Temp src --      SpO2 07/02/21 1125 98 %     Weight --      Height --      Head Circumference --      Peak Flow --      Pain Score 07/02/21 1132 6     Pain Loc --      Pain Edu? --      Excl. in GC? --    No data found.  Updated Vital Signs BP (!) 149/89    Pulse 100    Temp 97.9 F (36.6 C)    Resp 18    SpO2 98%   Visual Acuity Right Eye Distance:   Left Eye Distance:   Bilateral Distance:    Right Eye Near:   Left Eye Near:    Bilateral Near:     Physical Exam Vitals reviewed.  Constitutional:      General: She is awake. She is not in acute distress.    Appearance: Normal appearance. She is well-developed. She is not ill-appearing.     Comments: Very pleasant female appears stated age in no acute distress sitting comfortably in exam room  HENT:     Head: Normocephalic and atraumatic.  Cardiovascular:     Rate and Rhythm: Normal rate and regular rhythm.     Heart sounds: No murmur heard.    Comments: Pitting edema noted to mid anterior thigh bilaterally.  Negative Homans' sign. Pulmonary:     Effort: Pulmonary effort is normal.     Breath sounds: Normal breath sounds. No wheezing, rhonchi or rales.     Comments: Clear to auscultation bilaterally Abdominal:     Tenderness: There is no right CVA tenderness, left CVA tenderness, guarding or rebound.  Musculoskeletal:     Right lower leg: 1+ Edema present.     Left lower leg: 2+ Edema present.  Psychiatric:        Behavior: Behavior is cooperative.     UC Treatments / Results  Labs (all labs ordered are listed, but only abnormal results are displayed) Labs Reviewed  CBC WITH DIFFERENTIAL/PLATELET  COMPREHENSIVE METABOLIC PANEL  TSH  POCT URINALYSIS DIP (MANUAL ENTRY)    EKG   Radiology No results  found.  Procedures Procedures (including critical care time)  Medications Ordered in UC Medications - No data to display  Initial Impression / Assessment and Plan / UC Course  I have reviewed the triage vital signs and the nursing notes.  Pertinent labs & imaging results that were available during my care of the patient were reviewed by me and considered in my medical decision  making (see chart for details).     Suspect dependent edema given clinical presentation.  Patient was instructed to use conservative treatment measures including elevation of legs, compression stockings, reducing sodium consumption for symptom relief.  CBC, CMP, TSH, BNP obtained today-results pending.  UA was normal with no significant proteinuria.  Low suspicion for DVT but patient reported significant concern and requested ultrasound; ultrasound ordered to rule out DVT per patient request.  Diuretic initiation was deferred until underlying cause is determined we will consider low-dose medication if lab work is normal and DVT studies are negative.  Recommended she follow-up with PCP later this week for reevaluation.  Discussed that if she has any worsening symptoms including increased swelling, shortness of breath, palpitations, chest pain, weakness, nausea/vomiting she needs to be seen in the emergency room.  Strict return precautions given to which she expressed understanding.  Work excuse note provided.  Final Clinical Impressions(s) / UC Diagnoses   Final diagnoses:  Leg swelling  Bilateral leg pain     Discharge Instructions      Please go get ultrasound to rule out a blood clot.  We will contact you if this is positive and we need to arrange treatment.  We will also contact you if your lab work is abnormal.  In the meantime, keep your legs elevated, use compression stockings regularly, avoid sodium.  If everything is normal and continued symptoms we will consider low-dose fluid pill.  Please follow-up with  your primary care provider later this week.  If anything worsens and you develop chest pain, worsening swelling, nausea/vomiting, heart racing, weakness you need to go to the emergency room.     ED Prescriptions   None    PDMP not reviewed this encounter.   Terrilee Croak, PA-C 07/02/21 1217

## 2021-07-02 NOTE — Discharge Instructions (Signed)
Please go get ultrasound to rule out a blood clot.  We will contact you if this is positive and we need to arrange treatment.  We will also contact you if your lab work is abnormal.  In the meantime, keep your legs elevated, use compression stockings regularly, avoid sodium.  If everything is normal and continued symptoms we will consider low-dose fluid pill.  Please follow-up with your primary care provider later this week.  If anything worsens and you develop chest pain, worsening swelling, nausea/vomiting, heart racing, weakness you need to go to the emergency room. ?

## 2021-07-02 NOTE — ED Triage Notes (Signed)
Pt presents with bilateral foot swelling since yesterday.  ?

## 2021-07-03 LAB — CBC WITH DIFFERENTIAL/PLATELET
Basophils Absolute: 0 10*3/uL (ref 0.0–0.2)
Basos: 0 %
EOS (ABSOLUTE): 0.4 10*3/uL (ref 0.0–0.4)
Eos: 4 %
Hematocrit: 40 % (ref 34.0–46.6)
Hemoglobin: 13.1 g/dL (ref 11.1–15.9)
Immature Grans (Abs): 0 10*3/uL (ref 0.0–0.1)
Immature Granulocytes: 0 %
Lymphocytes Absolute: 2.6 10*3/uL (ref 0.7–3.1)
Lymphs: 28 %
MCH: 28.7 pg (ref 26.6–33.0)
MCHC: 32.8 g/dL (ref 31.5–35.7)
MCV: 88 fL (ref 79–97)
Monocytes Absolute: 0.6 10*3/uL (ref 0.1–0.9)
Monocytes: 6 %
Neutrophils Absolute: 5.8 10*3/uL (ref 1.4–7.0)
Neutrophils: 62 %
Platelets: 201 10*3/uL (ref 150–450)
RBC: 4.56 x10E6/uL (ref 3.77–5.28)
RDW: 13.6 % (ref 11.7–15.4)
WBC: 9.4 10*3/uL (ref 3.4–10.8)

## 2021-07-03 LAB — COMPREHENSIVE METABOLIC PANEL
ALT: 25 IU/L (ref 0–32)
AST: 27 IU/L (ref 0–40)
Albumin/Globulin Ratio: 1.6 (ref 1.2–2.2)
Albumin: 4.2 g/dL (ref 3.8–4.9)
Alkaline Phosphatase: 103 IU/L (ref 44–121)
BUN/Creatinine Ratio: 11 — ABNORMAL LOW (ref 12–28)
BUN: 8 mg/dL (ref 8–27)
Bilirubin Total: 0.2 mg/dL (ref 0.0–1.2)
CO2: 24 mmol/L (ref 20–29)
Calcium: 9.7 mg/dL (ref 8.7–10.3)
Chloride: 103 mmol/L (ref 96–106)
Creatinine, Ser: 0.74 mg/dL (ref 0.57–1.00)
Globulin, Total: 2.7 g/dL (ref 1.5–4.5)
Glucose: 114 mg/dL — ABNORMAL HIGH (ref 70–99)
Potassium: 4.3 mmol/L (ref 3.5–5.2)
Sodium: 140 mmol/L (ref 134–144)
Total Protein: 6.9 g/dL (ref 6.0–8.5)
eGFR: 93 mL/min/{1.73_m2} (ref >59)

## 2021-07-03 LAB — TSH: TSH: 2.28 u[IU]/mL (ref 0.450–4.500)

## 2021-11-27 ENCOUNTER — Emergency Department: Payer: Self-pay

## 2021-11-27 ENCOUNTER — Other Ambulatory Visit: Payer: Self-pay

## 2021-11-27 ENCOUNTER — Encounter: Payer: Self-pay | Admitting: Emergency Medicine

## 2021-11-27 ENCOUNTER — Emergency Department
Admission: EM | Admit: 2021-11-27 | Discharge: 2021-11-27 | Disposition: A | Discharge status: Home / Self Care | Payer: Self-pay | Attending: Emergency Medicine | Admitting: Emergency Medicine

## 2021-11-27 DIAGNOSIS — Y9389 Activity, other specified: Secondary | ICD-10-CM | POA: Insufficient documentation

## 2021-11-27 DIAGNOSIS — M70842 Other soft tissue disorders related to use, overuse and pressure, left hand: Secondary | ICD-10-CM | POA: Insufficient documentation

## 2021-11-27 DIAGNOSIS — M778 Other enthesopathies, not elsewhere classified: Secondary | ICD-10-CM

## 2021-11-27 DIAGNOSIS — S63502A Unspecified sprain of left wrist, initial encounter: Secondary | ICD-10-CM | POA: Insufficient documentation

## 2021-11-27 DIAGNOSIS — X509XXA Other and unspecified overexertion or strenuous movements or postures, initial encounter: Secondary | ICD-10-CM | POA: Insufficient documentation

## 2021-11-27 MED ORDER — PREDNISONE 20 MG PO TABS
20.0000 mg | ORAL_TABLET | Freq: Two times a day (BID) | ORAL | 0 refills | Status: DC
  Filled 2021-11-27: qty 10, 5d supply, fill #0

## 2021-11-27 MED ORDER — PREDNISONE 20 MG PO TABS
40.0000 mg | ORAL_TABLET | Freq: Every day | ORAL | 0 refills | Status: AC
  Filled 2021-11-27: qty 10, 5d supply, fill #0

## 2021-11-27 MED ORDER — CYCLOBENZAPRINE HCL 5 MG PO TABS
5.0000 mg | ORAL_TABLET | Freq: Three times a day (TID) | ORAL | 0 refills | Status: AC | PRN
  Filled 2021-11-27: qty 15, 5d supply, fill #0

## 2021-11-27 NOTE — ED Triage Notes (Signed)
Pt reports she was getting up from bed yesterday and felt a sharp pain in her left hand.

## 2021-11-27 NOTE — Discharge Instructions (Addendum)
Wear the splint as directed. Take the prescription meds as directed. HOLD your naprosyn while taking the prednisone. Apply ice and/or moist epsom salt soaks to reduce swelling. Follow-up with [facility] for continued care.

## 2021-11-27 NOTE — ED Provider Notes (Signed)
[facility] Emergency Department Provider Note     Event Date/Time   First MD Initiated Contact with Patient 11/27/21 1123     (approximate)   History   Hand Pain   HPI  Traci Torres is a 61 y.o. female right-hand-dominant, presents to the ED with acute pain at the left thumb.  Patient reports apparent injury yesterday when she went to adjust herself on her memory foam mattress.  She pressed her tented fingers and palms against the mattress and pushed, causing what she felt as a pop at the base of the thumb.  She presents today noting sharp pain to the left thumb at the [facility] and thenar prominence.  She reports only comfortable position is to have her thumb abducted across the palm.  She also notes some swelling to the thumb which denies any injury at this time.     Physical Exam   Triage Vital Signs: ED Triage Vitals  Enc Vitals Group     BP 11/27/21 1111 131/89     Pulse Rate 11/27/21 1111 74     Resp 11/27/21 1111 17     Temp 11/27/21 1111 98 F (36.7 C)     Temp Source 11/27/21 1111 Oral     SpO2 11/27/21 1111 97 %     Weight 11/27/21 1142 250 lb (113.4 kg)     Height 11/27/21 1142 5\' 7"  (1.702 m)     Head Circumference --      Peak Flow --      Pain Score 11/27/21 1112 10     Pain Loc --      Pain Edu? --      Excl. in GC? --     Most recent vital signs: Vitals:   11/27/21 1111  BP: 131/89  Pulse: 74  Resp: 17  Temp: 98 F (36.7 C)  SpO2: 97%    General Awake, no distress.  CV:  Good peripheral perfusion.  RESP:  Normal effort.  ABD:  No distention.  MSK:  Left hand and thumb without obvious deformity, dislocation, joint effusion.  Normal composite fist noted some mild soft tissue swelling across the distal and middle phalanx of the left thumb.  Patient with normal active range of motion with extension and flexion.  She tender to palpation across the thenar prominence at the [facility], as well as some pain to the volar wrist.  No  snuffbox tenderness elicited.  Negative Finkelstein on exam.   ED Results / Procedures / Treatments   Labs (all labs ordered are listed, but only abnormal results are displayed) Labs Reviewed - No data to display   EKG    RADIOLOGY  I personally viewed and evaluated these images as part of my medical decision making, as well as reviewing the written report by the radiologist.  ED Provider Interpretation: no acute fractures}  DG Hand Complete Left  Result Date: 11/27/2021 CLINICAL DATA:  Left hand pain EXAM: LEFT HAND - COMPLETE 3+ VIEW COMPARISON:  None Available. FINDINGS: There is no evidence of fracture or dislocation. Moderate osteoarthritis of the first carpometacarpal joint. Remaining joint spaces are relatively well preserved. Soft tissues are unremarkable. IMPRESSION: 1. Moderate osteoarthritis of the first carpometacarpal joint. 2. No acute osseous findings. Electronically Signed   By: 11/29/2021 D.O.   On: 11/27/2021 11:49     PROCEDURES:  Critical Care performed: No  Procedures   MEDICATIONS ORDERED IN ED: Medications - No data to display  IMPRESSION / MDM / ASSESSMENT AND PLAN / ED COURSE  I reviewed the triage vital signs and the nursing notes.                              Differential diagnosis includes, but is not limited to, thumb tendinitis, thumb fracture, thumb dislocation, tenosynovitis, scaphoid fracture  Patient's presentation is most consistent with acute complicated illness / injury requiring diagnostic workup.  Patient to the ED for evaluation of acute left hand pain at the thumb.  Patient's x-rays negative for any acute fracture based on my review of images.  Her exam is more consistent with a thumb sprain given her hyperextension injury mechanism.  Patient's diagnosis is consistent with thumb sprain and tendinitis. Patient will be discharged home with prescriptions for prednisone and Flexeril. Patient is to follow up with Ortho as needed  or otherwise directed. Patient is given ED precautions to return to the ED for any worsening or new symptoms.     FINAL CLINICAL IMPRESSION(S) / ED DIAGNOSES   Final diagnoses:  Tendinitis of thumb  Wrist sprain, left, initial encounter     Rx / DC Orders   ED Discharge Orders          Ordered    predniSONE (DELTASONE) 20 MG tablet  2 times daily with meals,   Status:  Discontinued        11/27/21 1241    cyclobenzaprine (FLEXERIL) 5 MG tablet  3 times daily PRN        11/27/21 1241    predniSONE (DELTASONE) 20 MG tablet  Daily with breakfast        11/27/21 1246             Note:  This document was prepared using Dragon voice recognition software and may include unintentional dictation errors.    Lissa Hoard, PA-C 11/27/21 1352    Minna Antis, MD 11/27/21 1912

## 2021-11-28 ENCOUNTER — Other Ambulatory Visit: Payer: Self-pay | Admitting: Pharmacy Technician

## 2021-11-28 ENCOUNTER — Other Ambulatory Visit: Payer: Self-pay

## 2021-11-28 NOTE — Patient Outreach (Signed)
Patient only signed DOH Attestation.  Would need to provide current year's household income if PAP medications were needed.  Taaliyah Delpriore J. Blas Riches Patient Advocate Specialist [facility]  

## 2021-12-18 ENCOUNTER — Other Ambulatory Visit: Payer: Self-pay

## 2021-12-18 MED ORDER — OPZELURA 1.5 % EX CREA
TOPICAL_CREAM | CUTANEOUS | 3 refills | Status: AC
  Filled 2021-12-18: qty 60, 28d supply, fill #0

## 2022-01-14 ENCOUNTER — Ambulatory Visit: Payer: BC Managed Care – PPO | Admitting: Dermatology

## 2022-05-11 ENCOUNTER — Other Ambulatory Visit: Payer: Self-pay

## 2022-05-25 IMAGING — CT CT ABD-PELV W/ CM
2 of 5 series · 15 of 46 positions shown, 17 images · IV contrast (APPLIED)
Comparison: None.

CLINICAL DATA: Acute, nonlocalized abdominal pain.  Nausea.

EXAM:
CT ABDOMEN AND PELVIS WITH CONTRAST
TECHNIQUE: Multidetector CT imaging of the abdomen and pelvis was performed
using the standard protocol following bolus administration of
intravenous contrast.
CONTRAST:  125mL OMNIPAQUE IOHEXOL 300 MG/ML  SOLN

[Series 2: routine abd/pel with · axial · 0.95mm/px · z∈[-1355,-890]mm · 12 of 107 slices shown, 14 images]
[im 7/107  soft-tissue]
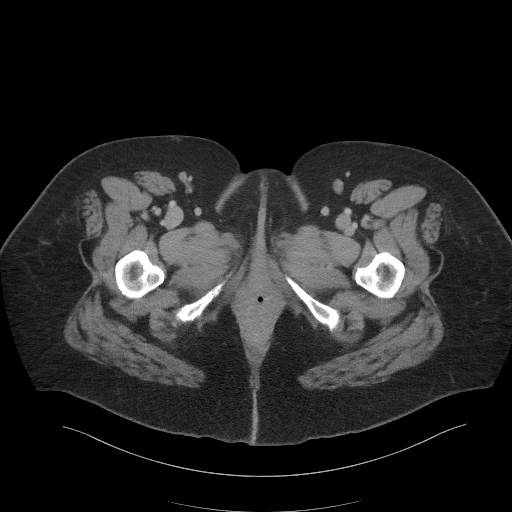
[im 7/107  bone]
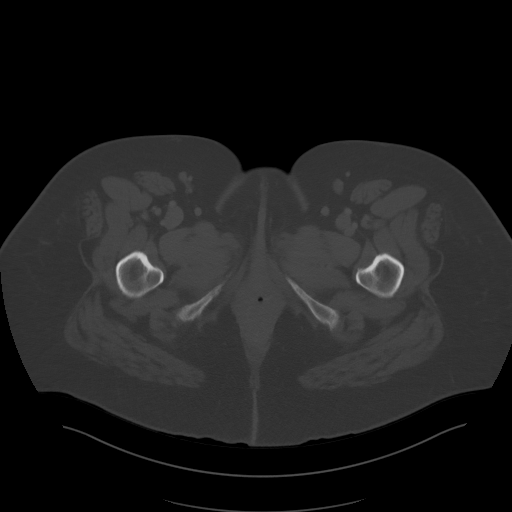
[im 19/107  soft-tissue]
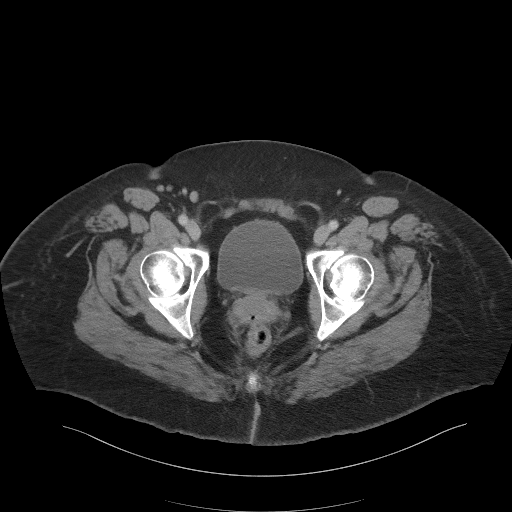
[im 25/107  soft-tissue]
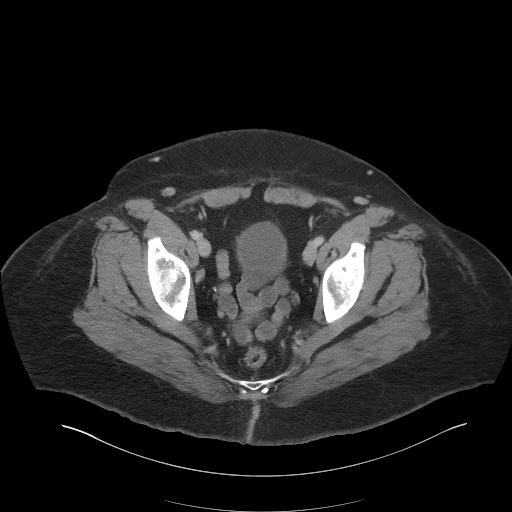
[im 32/107  soft-tissue]
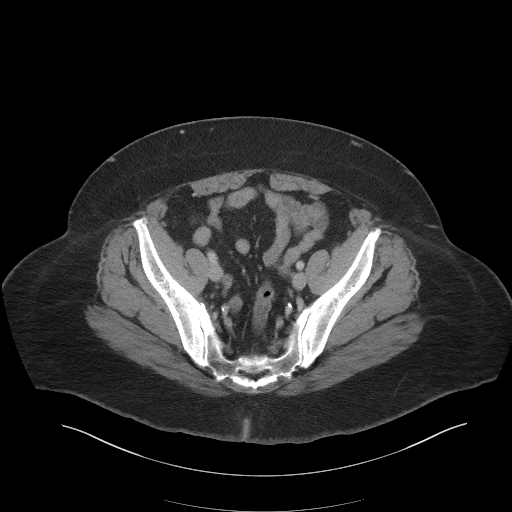
[im 44/107  soft-tissue]
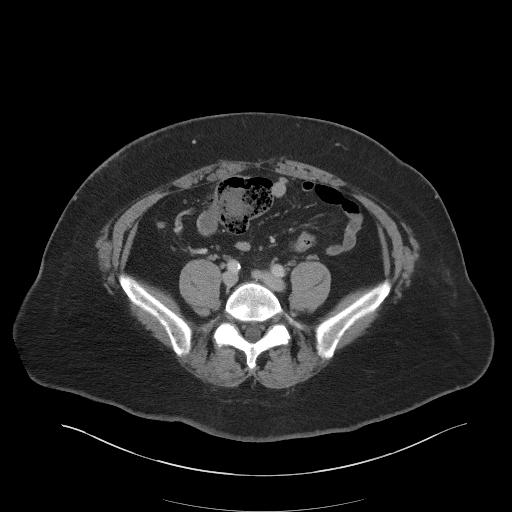
[im 50/107  soft-tissue]
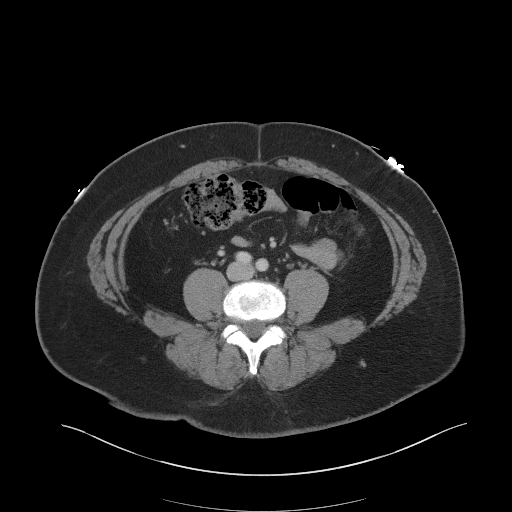
[im 57/107  soft-tissue]
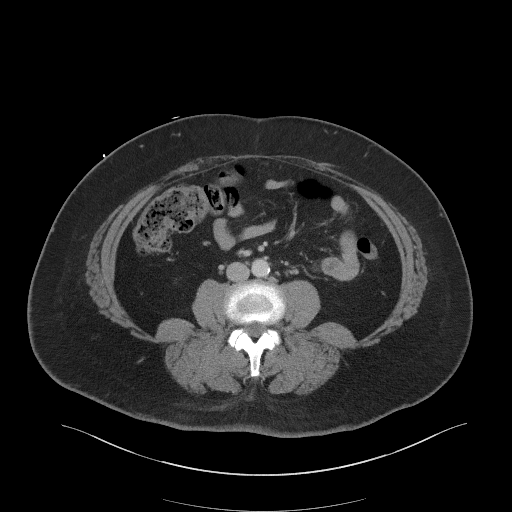
[im 69/107  soft-tissue]
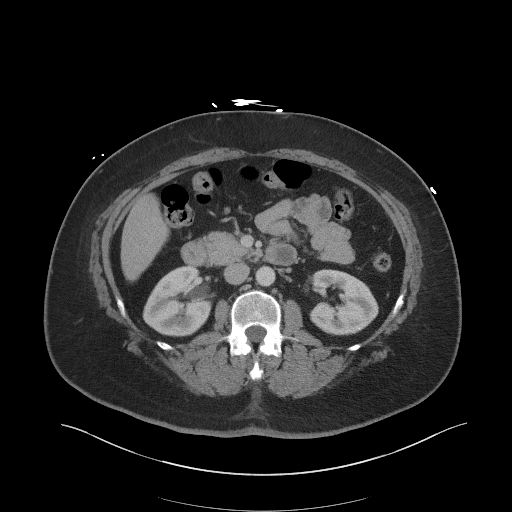
[im 75/107  soft-tissue]
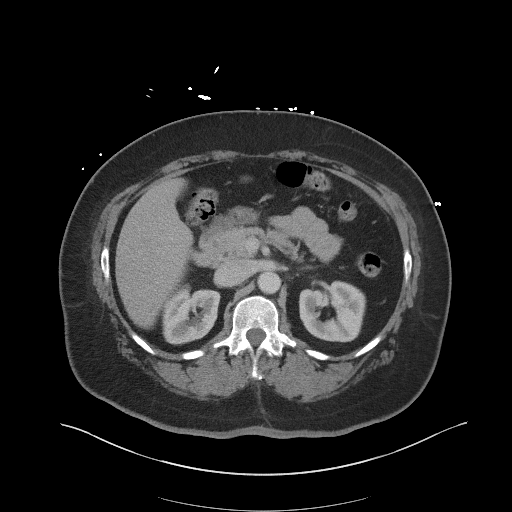
[im 75/107  bone]
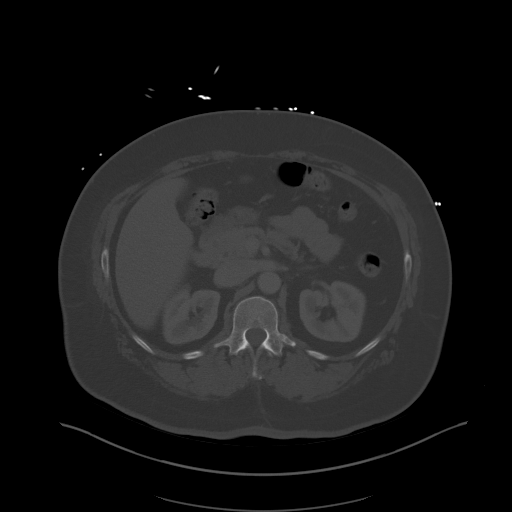
[im 82/107  soft-tissue]
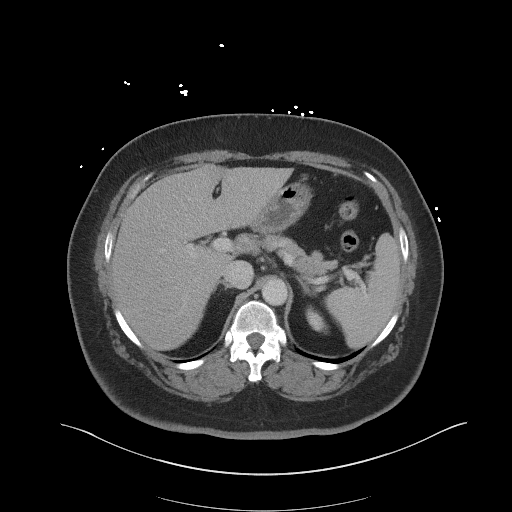
[im 94/107  soft-tissue]
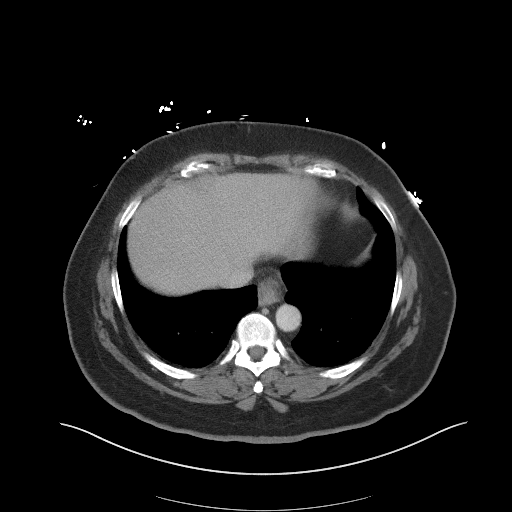
[im 100/107  soft-tissue]
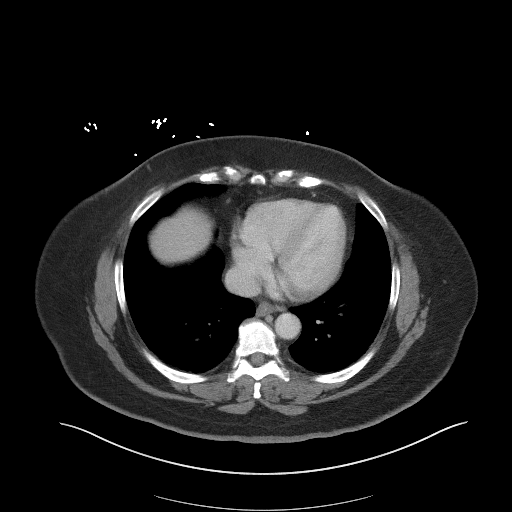

[Series 5: coronal st · coronal · 0.76mm/px · 3 of 107 slices shown]
[im 36/107  soft-tissue]
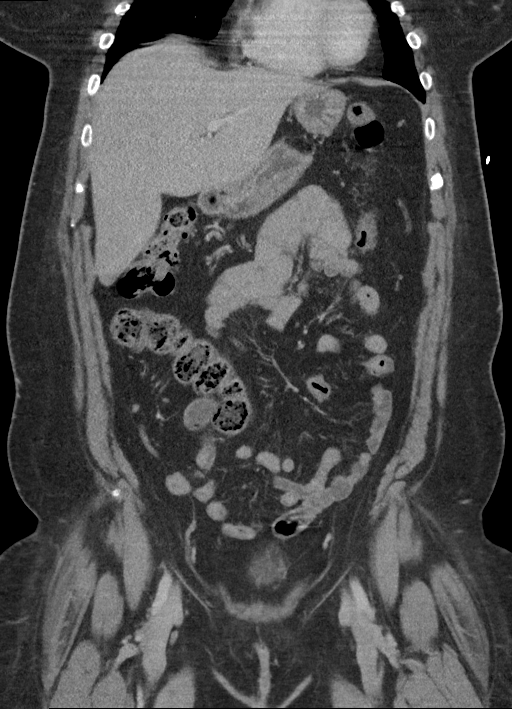
[im 48/107  soft-tissue]
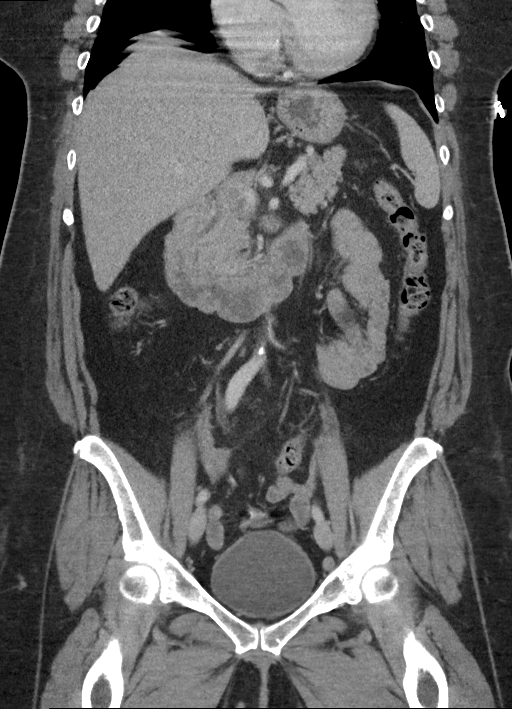
[im 59/107  soft-tissue]
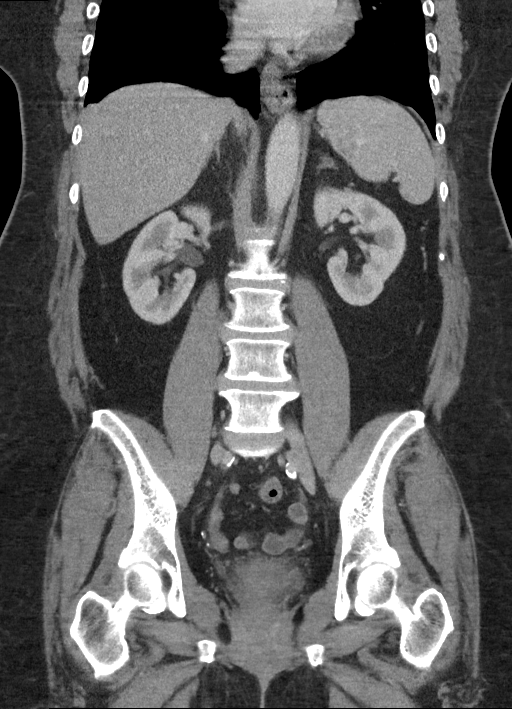

[15 of 46 positions shown; findings below may reference images not displayed]

FINDINGS: Lower chest: The visualized lung bases are clear bilaterally. The
visualized heart and pericardium are unremarkable. Small hiatal
hernia.

Hepatobiliary: 18 mm low-attenuation lesion within the a posterior
right hepatic lobe is not well characterized on this examination,
but may represent the 18 mm hemangioma described on sonogram of
12/16/2009. Those images are unavailable for direct comparison at
this time. The liver is otherwise unremarkable. Cholecystectomy has
been performed. No intra or extrahepatic biliary ductal dilation.

Pancreas: Unremarkable. No pancreatic ductal dilatation or
surrounding inflammatory changes.

Spleen: Unremarkable

Adrenals/Urinary Tract: Adrenal glands are unremarkable. Kidneys are
normal, without renal calculi, focal lesion, or hydronephrosis.
Bladder is unremarkable.

Stomach/Bowel: The stomach, small bowel, and large bowel are
unremarkable. Appendix normal. No free intraperitoneal gas or fluid.

Vascular/Lymphatic: Mild atherosclerotic calcification noted within
the abdominal aorta. No aortic aneurysm. No pathologic adenopathy
within the abdomen and pelvis.

Reproductive: Hysterectomy has been performed. The residual ovaries
are unremarkable. There is prominent soft tissue seen at the vaginal
cuff, of unclear significance. This may represent the residual
cervix in the setting of a supracervical hysterectomy. This,
however, is not well characterized on this examination.

Other: Small bilateral fat containing inguinal hernias are present.
Small fat containing umbilical hernia.

Musculoskeletal: No lytic or blastic bone lesion identified.
IMPRESSION: No acute intra-abdominal pathology identified. No definite
radiographic explanation for the patient's reported symptoms.

18 mm indeterminate low-attenuation lesion within the posterior
right hepatic lobe, possibly representing the hemangioma described
on a remote sonogram of 12/16/2009.

Prominent soft tissue noted at the vaginal cuff. Correlation with
clinical examination would be helpful in excluding a focal mass in
this location.

Aortic Atherosclerosis (407G0-HYE.E).

## 2022-07-24 ENCOUNTER — Other Ambulatory Visit (HOSPITAL_COMMUNITY): Payer: Self-pay

## 2023-05-17 IMAGING — US US EXTREM LOW VENOUS
1 series · 13 of 24 positions shown · non-contrast
Comparison: None.

CLINICAL DATA: Bilateral lower extremity swelling and pain



[Series 1: us extrem low venous · 0.08mm/px · 13 of 46 slices shown]
[im 1/46]
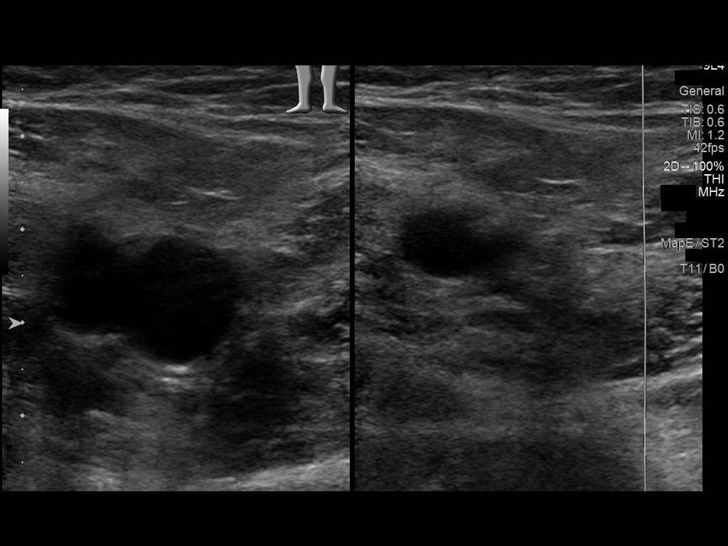
[im 4/46]
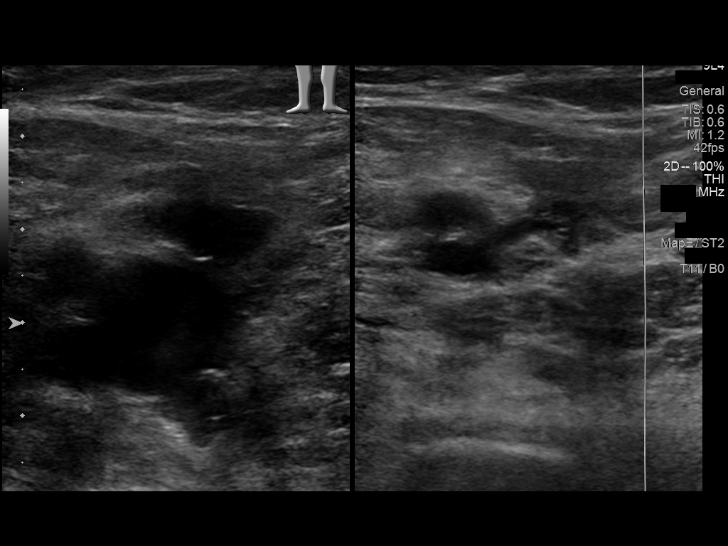
[im 8/46]
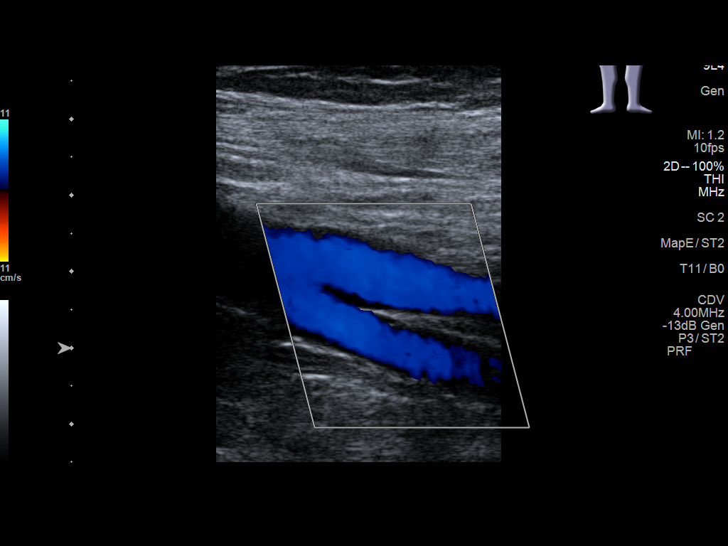
[im 12/46]
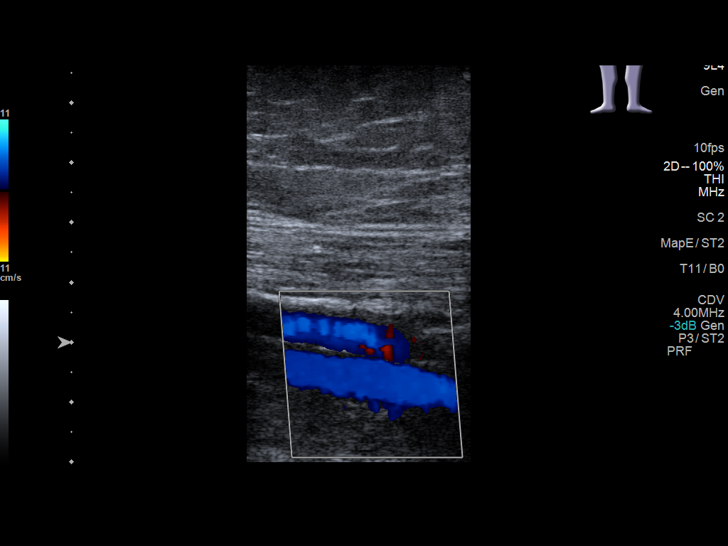
[im 16/46]
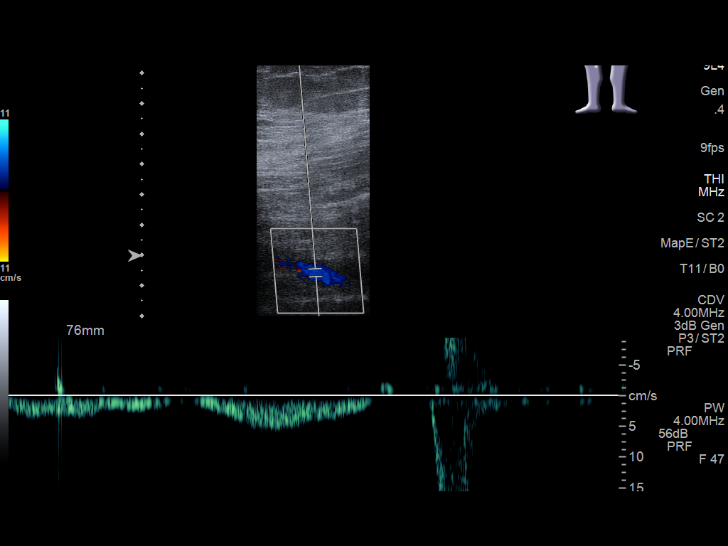
[im 20/46]
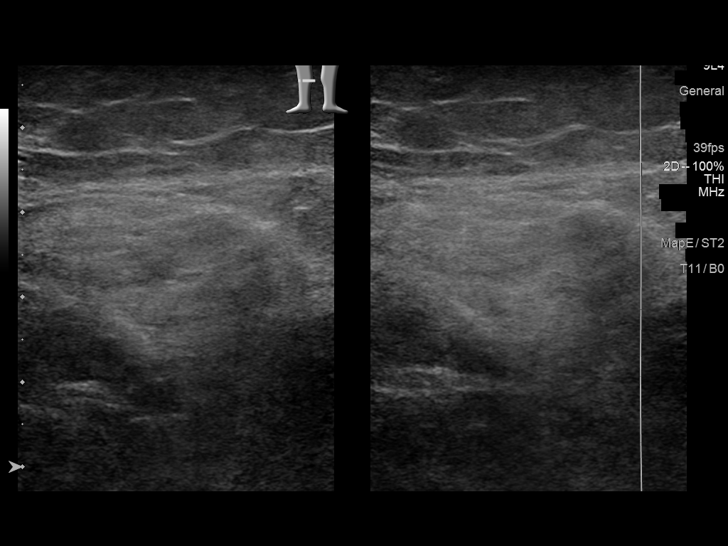
[im 24/46]
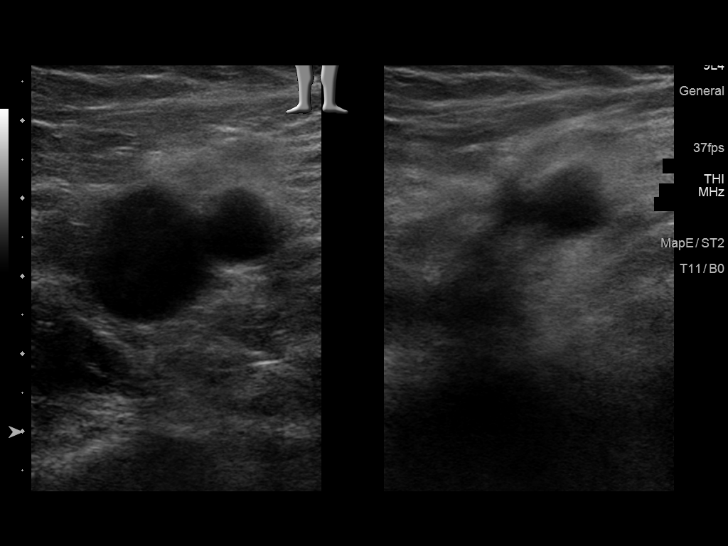
[im 26/46]
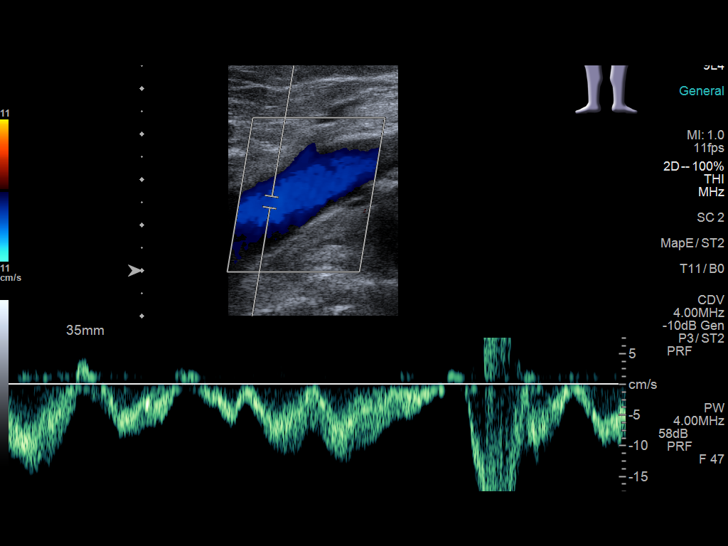
[im 30/46]
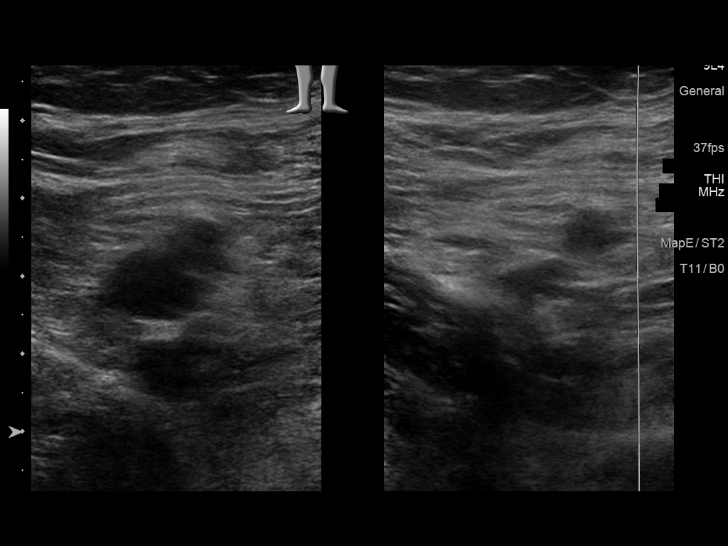
[im 34/46]
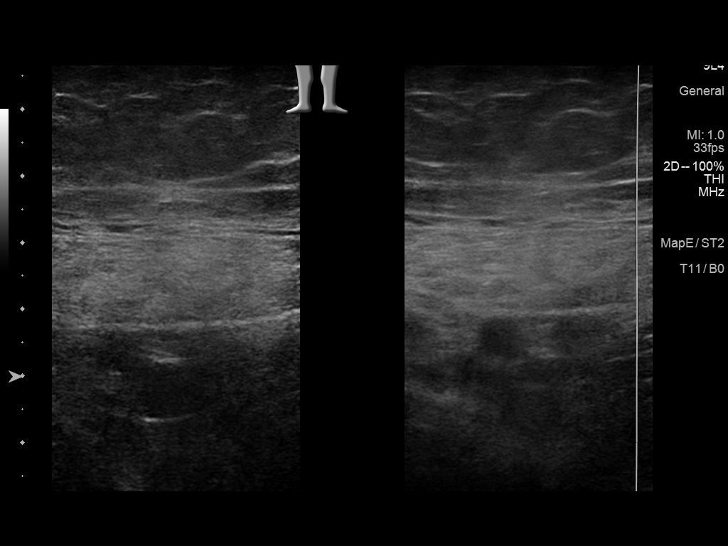
[im 38/46]
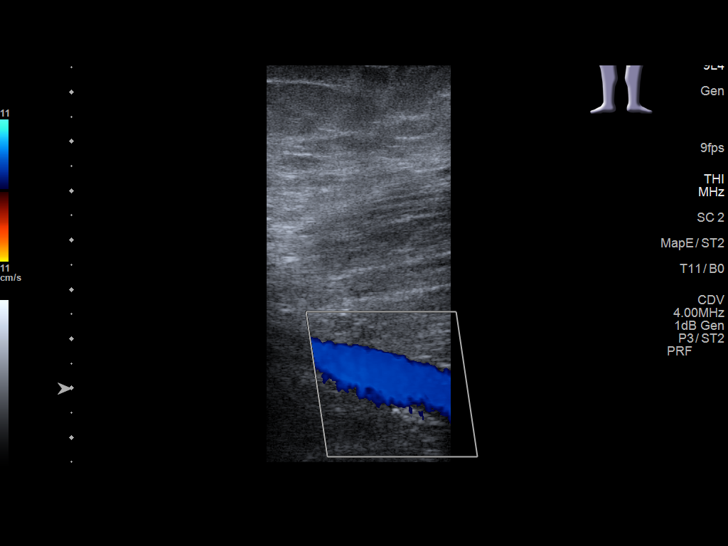
[im 42/46]
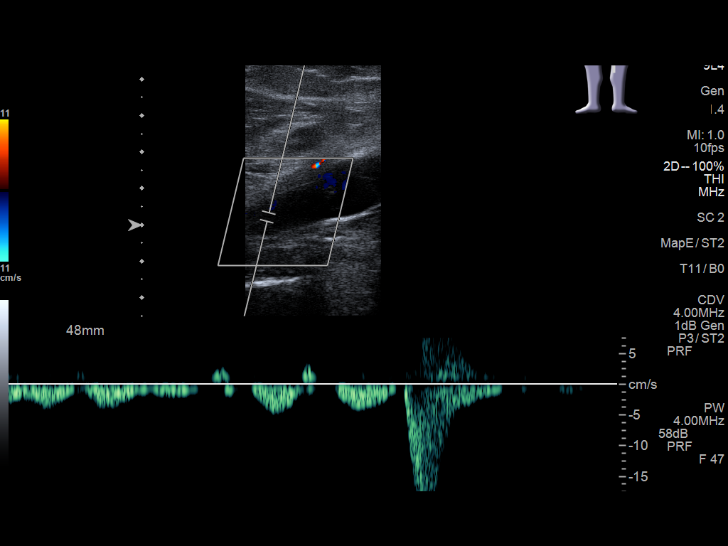
[im 46/46]
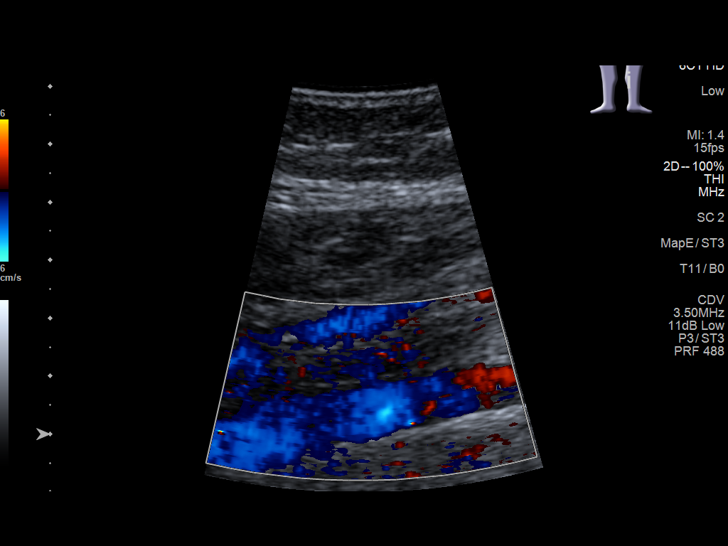

[13 of 24 positions shown; findings below may reference images not displayed]

FINDINGS: RIGHT LOWER EXTREMITY

Common Femoral Vein: No evidence of thrombus. Normal
compressibility, respiratory phasicity and response to augmentation.

Saphenofemoral Junction: No evidence of thrombus. Normal
compressibility and flow on color Doppler imaging.

Profunda Femoral Vein: No evidence of thrombus. Normal
compressibility and flow on color Doppler imaging.

Femoral Vein: No evidence of thrombus. Normal compressibility,
respiratory phasicity and response to augmentation.

Popliteal Vein: No evidence of thrombus. Normal compressibility,
respiratory phasicity and response to augmentation.

Calf Veins: No evidence of thrombus. Normal compressibility and flow
on color Doppler imaging.

LEFT LOWER EXTREMITY

Common Femoral Vein: No evidence of thrombus. Normal
compressibility, respiratory phasicity and response to augmentation.

Saphenofemoral Junction: No evidence of thrombus. Normal
compressibility and flow on color Doppler imaging.

Profunda Femoral Vein: No evidence of thrombus. Normal
compressibility and flow on color Doppler imaging.

Femoral Vein: No evidence of thrombus. Normal compressibility,
respiratory phasicity and response to augmentation.

Popliteal Vein: No evidence of thrombus. Normal compressibility,
respiratory phasicity and response to augmentation.

Calf Veins: No evidence of thrombus. Normal compressibility and flow
on color Doppler imaging.
IMPRESSION: No evidence of deep venous thrombosis in either lower extremity.

## 2023-07-13 ENCOUNTER — Other Ambulatory Visit: Payer: Self-pay
